# Patient Record
Sex: Male | Born: 1950 | Race: White | Hispanic: No | Marital: Married | Smoking: Former smoker
Health system: Southern US, Community
[De-identification: ages and names within clinical notes are randomized; demographics above are authoritative.]

## PROBLEM LIST (undated history)

## (undated) ENCOUNTER — Emergency Department (HOSPITAL_COMMUNITY): Discharge status: Against Medical Advice | Payer: BC Managed Care – PPO

## (undated) DIAGNOSIS — C801 Malignant (primary) neoplasm, unspecified: Secondary | ICD-10-CM

## (undated) DIAGNOSIS — E785 Hyperlipidemia, unspecified: Secondary | ICD-10-CM

## (undated) DIAGNOSIS — I10 Essential (primary) hypertension: Secondary | ICD-10-CM

## (undated) DIAGNOSIS — Z923 Personal history of irradiation: Secondary | ICD-10-CM

## (undated) DIAGNOSIS — E119 Type 2 diabetes mellitus without complications: Secondary | ICD-10-CM

## (undated) HISTORY — PX: ORTHOPEDIC SURGERY: SHX850

## (undated) HISTORY — PX: CARDIAC CATHETERIZATION: SHX172

## (undated) HISTORY — PX: OTHER SURGICAL HISTORY: SHX169

## (undated) HISTORY — DX: Personal history of irradiation: Z92.3

## (undated) HISTORY — DX: Hyperlipidemia, unspecified: E78.5

## (undated) HISTORY — DX: Type 2 diabetes mellitus without complications: E11.9

## (undated) HISTORY — PX: CORONARY ANGIOPLASTY: SHX604

## (undated) HISTORY — DX: Essential (primary) hypertension: I10

## (undated) HISTORY — DX: Malignant (primary) neoplasm, unspecified: C80.1

---

## 2002-01-04 ENCOUNTER — Ambulatory Visit (HOSPITAL_COMMUNITY): Admission: RE | Admit: 2002-01-04 | Discharge: 2002-01-04 | Payer: Self-pay | Admitting: *Deleted

## 2002-01-04 ENCOUNTER — Encounter (INDEPENDENT_AMBULATORY_CARE_PROVIDER_SITE_OTHER): Payer: Self-pay

## 2010-04-20 ENCOUNTER — Ambulatory Visit
Admission: RE | Admit: 2010-04-20 | Discharge: 2010-05-15 | Payer: Self-pay | Source: Home / Self Care | Attending: Radiation Oncology | Admitting: Radiation Oncology

## 2010-05-01 ENCOUNTER — Encounter
Admission: RE | Admit: 2010-05-01 | Discharge: 2010-05-01 | Payer: Self-pay | Source: Home / Self Care | Attending: Urology | Admitting: Urology

## 2010-05-16 ENCOUNTER — Ambulatory Visit: Payer: BC Managed Care – PPO | Admitting: Radiation Oncology

## 2010-06-13 LAB — COMPREHENSIVE METABOLIC PANEL
ALT: 28 U/L (ref 0–53)
AST: 35 U/L (ref 0–37)
Alkaline Phosphatase: 62 U/L (ref 39–117)
CO2: 31 mEq/L (ref 19–32)
Calcium: 9.9 mg/dL (ref 8.4–10.5)
GFR calc Af Amer: >60 mL/min (ref >60)
Potassium: 4.6 mEq/L (ref 3.5–5.1)
Sodium: 139 mEq/L (ref 135–145)
Total Protein: 6.7 g/dL (ref 6.0–8.3)

## 2010-06-13 LAB — CBC
Hemoglobin: 14.8 g/dL (ref 13.0–17.0)
MCHC: 34.6 g/dL (ref 30.0–36.0)
WBC: 6.4 10*3/uL (ref 4.0–10.5)

## 2010-06-20 ENCOUNTER — Ambulatory Visit (HOSPITAL_COMMUNITY): Payer: BC Managed Care – PPO | Attending: Urology

## 2010-06-20 ENCOUNTER — Ambulatory Visit (HOSPITAL_BASED_OUTPATIENT_CLINIC_OR_DEPARTMENT_OTHER)
Admission: RE | Admit: 2010-06-20 | Discharge: 2010-06-20 | Disposition: A | Discharge status: Home / Self Care | Payer: BC Managed Care – PPO | Attending: Urology | Admitting: Urology

## 2010-06-20 DIAGNOSIS — Z79899 Other long term (current) drug therapy: Secondary | ICD-10-CM | POA: Insufficient documentation

## 2010-06-20 DIAGNOSIS — C61 Malignant neoplasm of prostate: Secondary | ICD-10-CM | POA: Insufficient documentation

## 2010-06-20 DIAGNOSIS — Z9861 Coronary angioplasty status: Secondary | ICD-10-CM | POA: Insufficient documentation

## 2010-06-20 DIAGNOSIS — I2510 Atherosclerotic heart disease of native coronary artery without angina pectoris: Secondary | ICD-10-CM | POA: Insufficient documentation

## 2010-06-20 DIAGNOSIS — I10 Essential (primary) hypertension: Secondary | ICD-10-CM | POA: Insufficient documentation

## 2010-06-20 DIAGNOSIS — Z7982 Long term (current) use of aspirin: Secondary | ICD-10-CM | POA: Insufficient documentation

## 2010-06-21 LAB — GLUCOSE, CAPILLARY: Glucose-Capillary: 171 mg/dL — ABNORMAL HIGH (ref 70–99)

## 2010-07-10 ENCOUNTER — Ambulatory Visit: Payer: BC Managed Care – PPO | Attending: Radiation Oncology | Admitting: Radiation Oncology

## 2010-07-10 DIAGNOSIS — C61 Malignant neoplasm of prostate: Secondary | ICD-10-CM | POA: Insufficient documentation

## 2010-07-10 DIAGNOSIS — E119 Type 2 diabetes mellitus without complications: Secondary | ICD-10-CM | POA: Insufficient documentation

## 2010-07-10 DIAGNOSIS — R350 Frequency of micturition: Secondary | ICD-10-CM | POA: Insufficient documentation

## 2010-07-10 DIAGNOSIS — R300 Dysuria: Secondary | ICD-10-CM | POA: Insufficient documentation

## 2010-08-31 NOTE — Op Note (Signed)
   NAMEDANEN, LAPAGLIA NO.:  [id number]   MEDICAL RECORD NO.:  [id number]                   PATIENT TYPE:  AMB   LOCATION:  ENDO                                 FACILITY:  [facility]   PHYSICIAN:  Georgiana Spinner, M.D.                 DATE OF BIRTH:  [birth year]   DATE OF PROCEDURE:  DATE OF DISCHARGE:                                 OPERATIVE REPORT   PROCEDURE:  Upper endoscopy with biopsy.   INDICATIONS FOR PROCEDURE:  Gastroesophageal reflux disease.   ANESTHESIA:  Demerol 40, Versed 5 mg.   DESCRIPTION OF PROCEDURE:  With the patient mildly sedated in the left  lateral decubitus position, the Olympus videoscopic endoscope was inserted  in the mouth and passed under direct vision through the esophagus which  appeared normal until we reached the distal esophagus and there were changes  of Barrett's esophagus and esophagitis seen and photographed. Once biopsied,  we entered into the stomach through a hiatal hernia. The fundus and body  appeared normal. The antrum showed changes of gastritis, photographed and  biopsied. We entered into the duodenal bulb which showed duodenitis, second  portion of the duodenum appeared normal. From this point, the endoscope was  slowly withdrawn taking circumferential views of the entire duodenal mucosa  until the endoscope was then pulled back in the stomach, placed in  retroflexion to view the stomach from below and a hiatal hernia was seen.  The endoscope was straightened and withdrawn taking circumferential views of  the remaining gastric and esophageal mucosa. The patient's vital signs and  pulse oximeter remained stable. The patient tolerated the procedure well  without apparent complications.   FINDINGS:  Changes of duodenitis, gastritis, esophagitis with Barrett's  esophagus.   PLAN:  Await biopsy report. The patient will call me for results and  followup with me as an outpatient. Proceed to colonoscopy as  planned.                                                Georgiana Spinner, M.D.    GMO/MEDQ  D:  01/04/2002  T:  01/04/2002  Job:  [id number]

## 2010-08-31 NOTE — Op Note (Signed)
   NAMEARHAN, MCMANAMON NO.:  [id number]   MEDICAL RECORD NO.:  [id number]                   PATIENT TYPE:  AMB   LOCATION:  ENDO                                 FACILITY:  [facility]   PHYSICIAN:  Georgiana Spinner, M.D.                 DATE OF BIRTH:  Nov 26, 1950   DATE OF PROCEDURE:  DATE OF DISCHARGE:                                 OPERATIVE REPORT   PROCEDURE:  Colonoscopy.   INDICATIONS FOR PROCEDURE:  Hemoccult positivity.   ANESTHESIA:  Versed 1 mg.   DESCRIPTION OF PROCEDURE:  With the patient mildly sedated in the left  lateral decubitus position, a rectal exam was performed which was  unremarkable. Subsequently, the Olympus videoscopic colonoscope was inserted  into the rectum and passed under direct vision to the cecum identified by  the ileocecal valve and appendiceal orifice both of which were photographed.  From this point, the colonoscope was slowly withdrawn taking circumferential  views of the entire colonic mucosa stopping then only in the rectum which  appeared normal on direct and showed hemorrhoids on retroflexed view. The  endoscope was straightened and withdrawn. The patient's vital signs and  pulse oximeter remained stable. The patient tolerated the procedure well  without apparent complications.   FINDINGS:  Interna hemorrhoids, otherwise, unremarkable examination.   PLAN:  Repeat exam in five years due to family history. See endoscopy note  for further details.                                                Georgiana Spinner, M.D.    GMO/MEDQ  D:  01/04/2002  T:  01/04/2002  Job:  [id number]

## 2015-01-04 DIAGNOSIS — I10 Essential (primary) hypertension: Secondary | ICD-10-CM | POA: Insufficient documentation

## 2015-01-04 DIAGNOSIS — I2510 Atherosclerotic heart disease of native coronary artery without angina pectoris: Secondary | ICD-10-CM | POA: Insufficient documentation

## 2015-01-04 DIAGNOSIS — E782 Mixed hyperlipidemia: Secondary | ICD-10-CM | POA: Insufficient documentation

## 2015-01-04 DIAGNOSIS — E119 Type 2 diabetes mellitus without complications: Secondary | ICD-10-CM | POA: Insufficient documentation

## 2015-06-06 DIAGNOSIS — K219 Gastro-esophageal reflux disease without esophagitis: Secondary | ICD-10-CM | POA: Diagnosis not present

## 2015-06-19 DIAGNOSIS — N3289 Other specified disorders of bladder: Secondary | ICD-10-CM | POA: Diagnosis not present

## 2015-06-19 DIAGNOSIS — C61 Malignant neoplasm of prostate: Secondary | ICD-10-CM | POA: Diagnosis not present

## 2015-07-25 DIAGNOSIS — I2510 Atherosclerotic heart disease of native coronary artery without angina pectoris: Secondary | ICD-10-CM | POA: Diagnosis not present

## 2015-08-02 DIAGNOSIS — I2510 Atherosclerotic heart disease of native coronary artery without angina pectoris: Secondary | ICD-10-CM | POA: Diagnosis not present

## 2015-08-02 DIAGNOSIS — I10 Essential (primary) hypertension: Secondary | ICD-10-CM | POA: Diagnosis not present

## 2015-08-02 DIAGNOSIS — E782 Mixed hyperlipidemia: Secondary | ICD-10-CM | POA: Diagnosis not present

## 2015-08-02 DIAGNOSIS — E119 Type 2 diabetes mellitus without complications: Secondary | ICD-10-CM | POA: Diagnosis not present

## 2015-08-25 DIAGNOSIS — E119 Type 2 diabetes mellitus without complications: Secondary | ICD-10-CM | POA: Diagnosis not present

## 2015-08-25 DIAGNOSIS — Z1389 Encounter for screening for other disorder: Secondary | ICD-10-CM | POA: Diagnosis not present

## 2015-08-25 DIAGNOSIS — E785 Hyperlipidemia, unspecified: Secondary | ICD-10-CM | POA: Diagnosis not present

## 2015-11-30 DIAGNOSIS — Z9181 History of falling: Secondary | ICD-10-CM | POA: Diagnosis not present

## 2015-11-30 DIAGNOSIS — Z0000 Encounter for general adult medical examination without abnormal findings: Secondary | ICD-10-CM | POA: Diagnosis not present

## 2015-12-20 DIAGNOSIS — N3289 Other specified disorders of bladder: Secondary | ICD-10-CM | POA: Diagnosis not present

## 2015-12-20 DIAGNOSIS — C61 Malignant neoplasm of prostate: Secondary | ICD-10-CM | POA: Diagnosis not present

## 2016-01-29 DIAGNOSIS — I2510 Atherosclerotic heart disease of native coronary artery without angina pectoris: Secondary | ICD-10-CM | POA: Diagnosis not present

## 2016-01-31 DIAGNOSIS — E782 Mixed hyperlipidemia: Secondary | ICD-10-CM | POA: Diagnosis not present

## 2016-01-31 DIAGNOSIS — I10 Essential (primary) hypertension: Secondary | ICD-10-CM | POA: Diagnosis not present

## 2016-01-31 DIAGNOSIS — E119 Type 2 diabetes mellitus without complications: Secondary | ICD-10-CM | POA: Diagnosis not present

## 2016-01-31 DIAGNOSIS — I2510 Atherosclerotic heart disease of native coronary artery without angina pectoris: Secondary | ICD-10-CM | POA: Diagnosis not present

## 2016-04-01 DIAGNOSIS — E1165 Type 2 diabetes mellitus with hyperglycemia: Secondary | ICD-10-CM | POA: Diagnosis not present

## 2016-04-01 DIAGNOSIS — Z23 Encounter for immunization: Secondary | ICD-10-CM | POA: Diagnosis not present

## 2016-06-18 DIAGNOSIS — N529 Male erectile dysfunction, unspecified: Secondary | ICD-10-CM | POA: Diagnosis not present

## 2016-06-18 DIAGNOSIS — C61 Malignant neoplasm of prostate: Secondary | ICD-10-CM | POA: Diagnosis not present

## 2016-07-01 DIAGNOSIS — E1165 Type 2 diabetes mellitus with hyperglycemia: Secondary | ICD-10-CM | POA: Diagnosis not present

## 2016-07-02 DIAGNOSIS — R972 Elevated prostate specific antigen [PSA]: Secondary | ICD-10-CM | POA: Diagnosis not present

## 2016-07-02 DIAGNOSIS — C61 Malignant neoplasm of prostate: Secondary | ICD-10-CM | POA: Diagnosis not present

## 2016-07-08 DIAGNOSIS — E1129 Type 2 diabetes mellitus with other diabetic kidney complication: Secondary | ICD-10-CM | POA: Diagnosis not present

## 2016-07-08 DIAGNOSIS — Z6831 Body mass index (BMI) 31.0-31.9, adult: Secondary | ICD-10-CM | POA: Diagnosis not present

## 2016-07-11 DIAGNOSIS — R300 Dysuria: Secondary | ICD-10-CM | POA: Diagnosis not present

## 2016-07-11 DIAGNOSIS — C61 Malignant neoplasm of prostate: Secondary | ICD-10-CM | POA: Diagnosis not present

## 2016-07-11 DIAGNOSIS — N390 Urinary tract infection, site not specified: Secondary | ICD-10-CM | POA: Diagnosis not present

## 2016-07-11 DIAGNOSIS — R972 Elevated prostate specific antigen [PSA]: Secondary | ICD-10-CM | POA: Diagnosis not present

## 2016-07-25 DIAGNOSIS — C61 Malignant neoplasm of prostate: Secondary | ICD-10-CM | POA: Diagnosis not present

## 2016-08-13 DIAGNOSIS — K5900 Constipation, unspecified: Secondary | ICD-10-CM | POA: Diagnosis not present

## 2016-08-13 DIAGNOSIS — K6289 Other specified diseases of anus and rectum: Secondary | ICD-10-CM | POA: Diagnosis not present

## 2016-08-22 DIAGNOSIS — C61 Malignant neoplasm of prostate: Secondary | ICD-10-CM | POA: Diagnosis not present

## 2016-08-22 DIAGNOSIS — N4289 Other specified disorders of prostate: Secondary | ICD-10-CM | POA: Diagnosis not present

## 2016-08-22 DIAGNOSIS — R972 Elevated prostate specific antigen [PSA]: Secondary | ICD-10-CM | POA: Diagnosis not present

## 2016-08-26 DIAGNOSIS — I2510 Atherosclerotic heart disease of native coronary artery without angina pectoris: Secondary | ICD-10-CM | POA: Diagnosis not present

## 2016-08-28 DIAGNOSIS — R972 Elevated prostate specific antigen [PSA]: Secondary | ICD-10-CM | POA: Diagnosis not present

## 2016-08-28 DIAGNOSIS — C61 Malignant neoplasm of prostate: Secondary | ICD-10-CM | POA: Diagnosis not present

## 2016-08-30 DIAGNOSIS — I10 Essential (primary) hypertension: Secondary | ICD-10-CM | POA: Diagnosis not present

## 2016-08-30 DIAGNOSIS — I2510 Atherosclerotic heart disease of native coronary artery without angina pectoris: Secondary | ICD-10-CM | POA: Diagnosis not present

## 2016-08-30 DIAGNOSIS — E782 Mixed hyperlipidemia: Secondary | ICD-10-CM | POA: Diagnosis not present

## 2016-08-30 DIAGNOSIS — E119 Type 2 diabetes mellitus without complications: Secondary | ICD-10-CM | POA: Diagnosis not present

## 2016-09-02 ENCOUNTER — Other Ambulatory Visit (HOSPITAL_COMMUNITY): Payer: Self-pay | Admitting: Urology

## 2016-09-02 DIAGNOSIS — C61 Malignant neoplasm of prostate: Secondary | ICD-10-CM

## 2016-09-04 DIAGNOSIS — Z6831 Body mass index (BMI) 31.0-31.9, adult: Secondary | ICD-10-CM | POA: Diagnosis not present

## 2016-09-04 DIAGNOSIS — M546 Pain in thoracic spine: Secondary | ICD-10-CM | POA: Diagnosis not present

## 2016-09-06 DIAGNOSIS — I2510 Atherosclerotic heart disease of native coronary artery without angina pectoris: Secondary | ICD-10-CM | POA: Diagnosis not present

## 2016-09-06 DIAGNOSIS — E782 Mixed hyperlipidemia: Secondary | ICD-10-CM | POA: Diagnosis not present

## 2016-09-06 DIAGNOSIS — E119 Type 2 diabetes mellitus without complications: Secondary | ICD-10-CM | POA: Diagnosis not present

## 2016-09-06 DIAGNOSIS — I10 Essential (primary) hypertension: Secondary | ICD-10-CM | POA: Diagnosis not present

## 2016-09-17 ENCOUNTER — Encounter (HOSPITAL_COMMUNITY)
Admission: RE | Admit: 2016-09-17 | Discharge: 2016-09-17 | Disposition: A | Discharge status: Home / Self Care | Payer: PPO | Source: Ambulatory Visit | Attending: Urology | Admitting: Urology

## 2016-09-17 DIAGNOSIS — C61 Malignant neoplasm of prostate: Secondary | ICD-10-CM

## 2016-09-17 MED ORDER — FLUDEOXYGLUCOSE F - 18 (FDG) INJECTION
9.3000 | Freq: Once | INTRAVENOUS | Status: AC | PRN
  Administered 2016-09-17: 9.3 via INTRAVENOUS

## 2016-09-18 DIAGNOSIS — N1330 Unspecified hydronephrosis: Secondary | ICD-10-CM | POA: Diagnosis not present

## 2016-09-18 DIAGNOSIS — C61 Malignant neoplasm of prostate: Secondary | ICD-10-CM | POA: Diagnosis not present

## 2016-09-26 DIAGNOSIS — C7951 Secondary malignant neoplasm of bone: Secondary | ICD-10-CM | POA: Diagnosis not present

## 2016-09-26 DIAGNOSIS — C61 Malignant neoplasm of prostate: Secondary | ICD-10-CM | POA: Diagnosis not present

## 2016-09-26 DIAGNOSIS — R937 Abnormal findings on diagnostic imaging of other parts of musculoskeletal system: Secondary | ICD-10-CM | POA: Diagnosis not present

## 2016-09-26 DIAGNOSIS — N1330 Unspecified hydronephrosis: Secondary | ICD-10-CM | POA: Diagnosis not present

## 2016-09-26 DIAGNOSIS — C779 Secondary and unspecified malignant neoplasm of lymph node, unspecified: Secondary | ICD-10-CM | POA: Diagnosis not present

## 2016-10-02 DIAGNOSIS — C61 Malignant neoplasm of prostate: Secondary | ICD-10-CM | POA: Diagnosis not present

## 2016-10-02 DIAGNOSIS — R972 Elevated prostate specific antigen [PSA]: Secondary | ICD-10-CM | POA: Diagnosis not present

## 2016-10-15 DIAGNOSIS — Z6829 Body mass index (BMI) 29.0-29.9, adult: Secondary | ICD-10-CM | POA: Diagnosis not present

## 2016-10-15 DIAGNOSIS — C61 Malignant neoplasm of prostate: Secondary | ICD-10-CM | POA: Diagnosis not present

## 2016-10-15 DIAGNOSIS — Z1389 Encounter for screening for other disorder: Secondary | ICD-10-CM | POA: Diagnosis not present

## 2016-10-15 DIAGNOSIS — C7951 Secondary malignant neoplasm of bone: Secondary | ICD-10-CM | POA: Diagnosis not present

## 2016-10-21 DIAGNOSIS — Z8042 Family history of malignant neoplasm of prostate: Secondary | ICD-10-CM | POA: Diagnosis not present

## 2016-10-21 DIAGNOSIS — C778 Secondary and unspecified malignant neoplasm of lymph nodes of multiple regions: Secondary | ICD-10-CM | POA: Diagnosis not present

## 2016-10-21 DIAGNOSIS — C7982 Secondary malignant neoplasm of genital organs: Secondary | ICD-10-CM | POA: Diagnosis not present

## 2016-10-21 DIAGNOSIS — C61 Malignant neoplasm of prostate: Secondary | ICD-10-CM | POA: Diagnosis not present

## 2016-10-21 DIAGNOSIS — Z801 Family history of malignant neoplasm of trachea, bronchus and lung: Secondary | ICD-10-CM | POA: Diagnosis not present

## 2016-10-21 DIAGNOSIS — C7951 Secondary malignant neoplasm of bone: Secondary | ICD-10-CM | POA: Diagnosis not present

## 2016-10-21 DIAGNOSIS — Z803 Family history of malignant neoplasm of breast: Secondary | ICD-10-CM | POA: Diagnosis not present

## 2016-10-31 DIAGNOSIS — E1165 Type 2 diabetes mellitus with hyperglycemia: Secondary | ICD-10-CM | POA: Diagnosis not present

## 2016-11-01 DIAGNOSIS — C61 Malignant neoplasm of prostate: Secondary | ICD-10-CM | POA: Diagnosis not present

## 2016-11-01 DIAGNOSIS — C7951 Secondary malignant neoplasm of bone: Secondary | ICD-10-CM | POA: Diagnosis not present

## 2016-11-05 DIAGNOSIS — C7951 Secondary malignant neoplasm of bone: Secondary | ICD-10-CM | POA: Diagnosis not present

## 2016-11-05 DIAGNOSIS — C61 Malignant neoplasm of prostate: Secondary | ICD-10-CM | POA: Diagnosis not present

## 2016-11-06 DIAGNOSIS — R339 Retention of urine, unspecified: Secondary | ICD-10-CM | POA: Diagnosis not present

## 2016-11-06 DIAGNOSIS — C61 Malignant neoplasm of prostate: Secondary | ICD-10-CM | POA: Diagnosis not present

## 2016-12-02 DIAGNOSIS — Z0000 Encounter for general adult medical examination without abnormal findings: Secondary | ICD-10-CM | POA: Diagnosis not present

## 2016-12-02 DIAGNOSIS — Z6831 Body mass index (BMI) 31.0-31.9, adult: Secondary | ICD-10-CM | POA: Diagnosis not present

## 2016-12-02 DIAGNOSIS — Z9181 History of falling: Secondary | ICD-10-CM | POA: Diagnosis not present

## 2016-12-02 DIAGNOSIS — E1165 Type 2 diabetes mellitus with hyperglycemia: Secondary | ICD-10-CM | POA: Diagnosis not present

## 2016-12-03 DIAGNOSIS — N1330 Unspecified hydronephrosis: Secondary | ICD-10-CM | POA: Diagnosis not present

## 2016-12-03 DIAGNOSIS — C61 Malignant neoplasm of prostate: Secondary | ICD-10-CM | POA: Diagnosis not present

## 2016-12-03 DIAGNOSIS — N134 Hydroureter: Secondary | ICD-10-CM | POA: Diagnosis not present

## 2016-12-03 DIAGNOSIS — C7951 Secondary malignant neoplasm of bone: Secondary | ICD-10-CM | POA: Diagnosis not present

## 2016-12-03 DIAGNOSIS — C7989 Secondary malignant neoplasm of other specified sites: Secondary | ICD-10-CM | POA: Diagnosis not present

## 2016-12-18 DIAGNOSIS — C61 Malignant neoplasm of prostate: Secondary | ICD-10-CM | POA: Diagnosis not present

## 2016-12-18 DIAGNOSIS — R351 Nocturia: Secondary | ICD-10-CM | POA: Diagnosis not present

## 2016-12-18 DIAGNOSIS — N1330 Unspecified hydronephrosis: Secondary | ICD-10-CM | POA: Diagnosis not present

## 2016-12-31 DIAGNOSIS — C7951 Secondary malignant neoplasm of bone: Secondary | ICD-10-CM | POA: Diagnosis not present

## 2016-12-31 DIAGNOSIS — C61 Malignant neoplasm of prostate: Secondary | ICD-10-CM | POA: Diagnosis not present

## 2016-12-31 DIAGNOSIS — Z515 Encounter for palliative care: Secondary | ICD-10-CM | POA: Diagnosis not present

## 2017-01-17 DIAGNOSIS — R339 Retention of urine, unspecified: Secondary | ICD-10-CM | POA: Diagnosis not present

## 2017-01-17 DIAGNOSIS — C61 Malignant neoplasm of prostate: Secondary | ICD-10-CM | POA: Diagnosis not present

## 2017-01-23 ENCOUNTER — Encounter: Payer: Self-pay | Admitting: Cardiology

## 2017-01-23 ENCOUNTER — Ambulatory Visit (INDEPENDENT_AMBULATORY_CARE_PROVIDER_SITE_OTHER): Payer: PPO | Admitting: Cardiology

## 2017-01-23 VITALS — BP 140/82 | HR 62 | Resp 17 | Ht 68.0 in | Wt 208.1 lb

## 2017-01-23 DIAGNOSIS — I2510 Atherosclerotic heart disease of native coronary artery without angina pectoris: Secondary | ICD-10-CM

## 2017-01-23 DIAGNOSIS — I10 Essential (primary) hypertension: Secondary | ICD-10-CM

## 2017-01-23 DIAGNOSIS — E119 Type 2 diabetes mellitus without complications: Secondary | ICD-10-CM | POA: Diagnosis not present

## 2017-01-23 DIAGNOSIS — E782 Mixed hyperlipidemia: Secondary | ICD-10-CM | POA: Diagnosis not present

## 2017-01-23 NOTE — Patient Instructions (Signed)
Medication Instructions:  Your physician recommends that you continue on your current medications as directed. Please refer to the Current Medication list given to you today.  Labwork: None    Testing/Procedures: None   Follow-Up: Your physician wants you to follow-up in: 6 months. You will receive a reminder letter in the mail two months in advance. If you don't receive a letter, please call our office to schedule the follow-up appointment.  Any Other Special Instructions Will Be Listed Below (If Applicable).  Please note that any paperwork needing to be filled out by the provider will need to be addressed at the front desk prior to seeing the provider. Please note that any paperwork FMLA, Disability or other documents regarding health condition is subject to a $25.00 charge that must be received prior to completion of paperwork in the form of a money order or check.    If you need a refill on your cardiac medications before your next appointment, please call your pharmacy.  

## 2017-01-23 NOTE — Progress Notes (Signed)
Cardiology Office Note:    Date:  01/23/2017   ID:  Perry Wolfe, DOB 06/15/1950, MRN [id number]  PCP:  Angelina Sheriff, MD  Cardiologist:  Jenne Campus, MD    Referring MD: Angelina Sheriff, MD   Chief Complaint  Patient presents with  . Follow-up    no concerns; states last March his PSA was higher than normal and biopsy came back normal; PET scan showed concern in pelvic area   I am doing well cardiac-wise  History of Present Illness:    Perry Wolfe is a 66 y.o. male  with coronary artery disease diabetes and essential hypertension. Overall, because he is doing well. Recently he was fine to have reactivation of his prostate cancer. Hormonal therapy has been initiated. His PSA went down to almost nothing. He is doing well, still active, walks a lot of no difficulty doing this.  Past Medical History:  Diagnosis Date  . Cancer ([place])   . Diabetes mellitus without complication ([place])   . Hx of radiation therapy   . Hyperlipidemia   . Hypertension     Past Surgical History:  Procedure Laterality Date  . CARDIAC CATHETERIZATION    . CORONARY ANGIOPLASTY    . ORTHOPEDIC SURGERY    . Prostate Injection      Current Medications: Current Meds  Medication Sig  . Ascorbic Acid (VITAMIN C) 1000 MG tablet Take 100,000 mg by mouth daily.  Marland Kitchen aspirin EC 81 MG tablet Take 81 mg by mouth daily.  . Calcium Citrate-Vitamin D (CALCIUM CITRATE CHEWY BITE PO) Take 650 mg by mouth 2 (two) times daily.  Marland Kitchen lisinopril (PRINIVIL,ZESTRIL) 5 MG tablet Take 5 mg by mouth daily.   . metformin (FORTAMET) 500 MG (OSM) 24 hr tablet Take 500 mg by mouth daily with breakfast.   . metoprolol succinate (TOPROL-XL) 25 MG 24 hr tablet Take 25 mg by mouth daily.  . nitroGLYCERIN (NITROSTAT) 0.4 MG SL tablet Place 0.4 mg under the tongue as needed for chest pain.  . Omega-3 Fatty Acids (FISH OIL) 1000 MG CAPS Take 1 capsule by mouth daily.  Marland Kitchen omeprazole (PRILOSEC) 20 MG capsule Take 20 mg by mouth  daily.  . rosuvastatin (CRESTOR) 10 MG tablet Take 10 mg by mouth daily.  . tamsulosin (FLOMAX) 0.4 MG CAPS capsule Take 0.8 mg by mouth daily.      Allergies:   Patient has no allergy information on record.   Social History   Social History  . Marital status: Married    Spouse name: N/A  . Number of children: N/A  . Years of education: N/A   Social History Main Topics  . Smoking status: Former Research scientist (life sciences)  . Smokeless tobacco: Never Used  . Alcohol use Yes  . Drug use: No  . Sexual activity: Not Asked   Other Topics Concern  . None   Social History Narrative  . None     Family History: The patient's family history includes Lung cancer in his father; Stroke in his father. ROS:   Please see the history of present illness.    All 14 point review of systems negative except as described per history of present illness  EKGs/Labs/Other Studies Reviewed:      Recent Labs: No results found for requested labs within last 8760 hours.  Recent Lipid Panel No results found for: CHOL, TRIG, HDL, CHOLHDL, VLDL, LDLCALC, LDLDIRECT  Physical Exam:    VS:  BP 140/82 (BP Location: Left Arm, Patient  Position: Sitting, Cuff Size: Large)   Pulse 62   Resp 17   Ht 5\' 8"  (1.727 m)   Wt 208 lb 1.9 oz (94.4 kg)   BMI 31.64 kg/m     Wt Readings from Last 3 Encounters:  01/23/17 208 lb 1.9 oz (94.4 kg)     GEN:  Well nourished, well developed in no acute distress HEENT: Normal NECK: No JVD; No carotid bruits LYMPHATICS: No lymphadenopathy CARDIAC: RRR, no murmurs, no rubs, no gallops RESPIRATORY:  Clear to auscultation without rales, wheezing or rhonchi  ABDOMEN: Soft, non-tender, non-distended MUSCULOSKELETAL:  No edema; No deformity  SKIN: Warm and dry LOWER EXTREMITIES: no swelling NEUROLOGIC:  Alert and oriented x 3 PSYCHIATRIC:  Normal affect   ASSESSMENT:    1. Coronary artery disease involving native coronary artery of native heart without angina pectoris   2.  Essential hypertension   3. Type 2 diabetes mellitus without complication, without long-term current use of insulin ([place])   4. Mixed hyperlipidemia    PLAN:    In order of problems listed above:  1. Coronary artery disease: Doing well from that point of asymptomatic, will continue present management 2. Essential hypertension slight elevated today but overall doing well and he tells me that he check his doctor at home and school. They'll continue present management. 3. Type 2 diabetes, (stable. 4. Dyslipidemia: His last cholesterol which was checked in may was perfect. 5. He did have echocardiogram done in our old office will retrieve results of it since he was not informed about   Medication Adjustments/Labs and Tests Ordered: Current medicines are reviewed at length with the patient today.  Concerns regarding medicines are outlined above.  No orders of the defined types were placed in this encounter.  Medication changes: No orders of the defined types were placed in this encounter.   Signed, Park Liter, MD, [facility] 01/23/2017 10:15 AM    [place]

## 2017-01-28 DIAGNOSIS — C7951 Secondary malignant neoplasm of bone: Secondary | ICD-10-CM | POA: Diagnosis not present

## 2017-01-28 DIAGNOSIS — C61 Malignant neoplasm of prostate: Secondary | ICD-10-CM | POA: Diagnosis not present

## 2017-02-05 DIAGNOSIS — Z1339 Encounter for screening examination for other mental health and behavioral disorders: Secondary | ICD-10-CM | POA: Diagnosis not present

## 2017-02-05 DIAGNOSIS — Z0000 Encounter for general adult medical examination without abnormal findings: Secondary | ICD-10-CM | POA: Diagnosis not present

## 2017-02-05 DIAGNOSIS — Z23 Encounter for immunization: Secondary | ICD-10-CM | POA: Diagnosis not present

## 2017-02-12 DIAGNOSIS — I700 Atherosclerosis of aorta: Secondary | ICD-10-CM | POA: Diagnosis not present

## 2017-02-12 DIAGNOSIS — F17210 Nicotine dependence, cigarettes, uncomplicated: Secondary | ICD-10-CM | POA: Diagnosis not present

## 2017-02-12 DIAGNOSIS — Z136 Encounter for screening for cardiovascular disorders: Secondary | ICD-10-CM | POA: Diagnosis not present

## 2017-02-25 DIAGNOSIS — C7951 Secondary malignant neoplasm of bone: Secondary | ICD-10-CM | POA: Diagnosis not present

## 2017-02-25 DIAGNOSIS — C61 Malignant neoplasm of prostate: Secondary | ICD-10-CM | POA: Diagnosis not present

## 2017-03-25 DIAGNOSIS — Z515 Encounter for palliative care: Secondary | ICD-10-CM | POA: Diagnosis not present

## 2017-03-25 DIAGNOSIS — C61 Malignant neoplasm of prostate: Secondary | ICD-10-CM | POA: Diagnosis not present

## 2017-03-25 DIAGNOSIS — C7951 Secondary malignant neoplasm of bone: Secondary | ICD-10-CM | POA: Diagnosis not present

## 2017-04-21 DIAGNOSIS — R339 Retention of urine, unspecified: Secondary | ICD-10-CM | POA: Diagnosis not present

## 2017-04-21 DIAGNOSIS — C61 Malignant neoplasm of prostate: Secondary | ICD-10-CM | POA: Diagnosis not present

## 2017-04-22 DIAGNOSIS — Z5111 Encounter for antineoplastic chemotherapy: Secondary | ICD-10-CM | POA: Diagnosis not present

## 2017-04-22 DIAGNOSIS — C61 Malignant neoplasm of prostate: Secondary | ICD-10-CM | POA: Diagnosis not present

## 2017-04-22 DIAGNOSIS — C7951 Secondary malignant neoplasm of bone: Secondary | ICD-10-CM | POA: Diagnosis not present

## 2017-05-20 DIAGNOSIS — C61 Malignant neoplasm of prostate: Secondary | ICD-10-CM | POA: Diagnosis not present

## 2017-05-20 DIAGNOSIS — C7951 Secondary malignant neoplasm of bone: Secondary | ICD-10-CM | POA: Diagnosis not present

## 2017-06-17 DIAGNOSIS — C61 Malignant neoplasm of prostate: Secondary | ICD-10-CM | POA: Diagnosis not present

## 2017-06-17 DIAGNOSIS — C7951 Secondary malignant neoplasm of bone: Secondary | ICD-10-CM | POA: Diagnosis not present

## 2017-07-15 DIAGNOSIS — C61 Malignant neoplasm of prostate: Secondary | ICD-10-CM | POA: Diagnosis not present

## 2017-07-15 DIAGNOSIS — C7951 Secondary malignant neoplasm of bone: Secondary | ICD-10-CM | POA: Diagnosis not present

## 2017-07-23 ENCOUNTER — Ambulatory Visit: Payer: PPO | Admitting: Cardiology

## 2017-07-23 ENCOUNTER — Encounter: Payer: Self-pay | Admitting: Cardiology

## 2017-07-23 VITALS — BP 110/64 | HR 59 | Ht 68.0 in | Wt 208.4 lb

## 2017-07-23 DIAGNOSIS — E782 Mixed hyperlipidemia: Secondary | ICD-10-CM | POA: Diagnosis not present

## 2017-07-23 DIAGNOSIS — E119 Type 2 diabetes mellitus without complications: Secondary | ICD-10-CM | POA: Diagnosis not present

## 2017-07-23 DIAGNOSIS — I10 Essential (primary) hypertension: Secondary | ICD-10-CM | POA: Diagnosis not present

## 2017-07-23 DIAGNOSIS — I2510 Atherosclerotic heart disease of native coronary artery without angina pectoris: Secondary | ICD-10-CM

## 2017-07-23 NOTE — Progress Notes (Signed)
Cardiology Office Note:    Date:  07/23/2017   ID:  Perry Wolfe, DOB November 23, 1950, MRN [id number]  PCP:  Angelina Sheriff, MD  Cardiologist:  Jenne Campus, MD    Referring MD: Angelina Sheriff, MD   Chief Complaint  Patient presents with  . Follow-up  Doing great  History of Present Illness:    Perry Wolfe is a 67 y.o. male with coronary artery disease status post drug-eluting stent to obtuse marginal branch in 2013.  Doing great asymptomatic work hardening to garden.  He can carry very heavy rocks with no major difficulties he does get some shortness of breath but otherwise doing well no chest pain tightness squeezing pressure pain chest.  He does have a problem with his prostate he is in the process of being treated by oncology team.  Apparently tomorrow he does have appointment with them.  Past Medical History:  Diagnosis Date  . Cancer ([place])   . Diabetes mellitus without complication ([place])   . Hx of radiation therapy   . Hyperlipidemia   . Hypertension       Current Medications: Current Meds  Medication Sig  . aspirin EC 81 MG tablet Take 81 mg by mouth every other day.   . Calcium Citrate-Vitamin D (CALCIUM CITRATE CHEWY BITE PO) Take 650 mg by mouth 2 (two) times daily.  Marland Kitchen lisinopril (PRINIVIL,ZESTRIL) 5 MG tablet Take 5 mg by mouth daily.   . metformin (FORTAMET) 500 MG (OSM) 24 hr tablet Take 500 mg by mouth 2 (two) times daily with a meal.   . metoprolol succinate (TOPROL-XL) 25 MG 24 hr tablet Take 25 mg by mouth daily.  . nitroGLYCERIN (NITROSTAT) 0.4 MG SL tablet Place 0.4 mg under the tongue as needed for chest pain.  Marland Kitchen omeprazole (PRILOSEC) 20 MG capsule Take 20 mg by mouth daily.  . rosuvastatin (CRESTOR) 10 MG tablet Take 10 mg by mouth daily.  . tamsulosin (FLOMAX) 0.4 MG CAPS capsule Take 0.8 mg by mouth daily.   Marland Kitchen UNABLE TO FIND Med Name: [id number]     Allergies:   Patient has no known allergies.   Social History   Socioeconomic History  .  Marital status: Married    Spouse name: Not on file  . Number of children: Not on file  . Years of education: Not on file  . Highest education level: Not on file  Occupational History  . Not on file  Social Needs  . Financial resource strain: Not on file  . Food insecurity:    Worry: Not on file    Inability: Not on file  . Transportation needs:    Medical: Not on file    Non-medical: Not on file  Tobacco Use  . Smoking status: Former Research scientist (life sciences)  . Smokeless tobacco: Never Used  Substance and Sexual Activity  . Alcohol use: Yes  . Drug use: No  . Sexual activity: Not on file  Lifestyle  . Physical activity:    Days per week: Not on file    Minutes per session: Not on file  . Stress: Not on file  Relationships  . Social connections:    Talks on phone: Not on file    Gets together: Not on file    Attends religious service: Not on file    Active member of club or organization: Not on file    Attends meetings of clubs or organizations: Not on file    Relationship status: Not  on file  Other Topics Concern  . Not on file  Social History Narrative  . Not on file     Family History: The patient's family history includes Lung cancer in his father; Stroke in his father. ROS:   Please see the history of present illness.    All 14 point review of systems negative except as described per history of present illness  EKGs/Labs/Other Studies Reviewed:      Recent Labs: No results found for requested labs within last 8760 hours.  Recent Lipid Panel No results found for: CHOL, TRIG, HDL, CHOLHDL, VLDL, LDLCALC, LDLDIRECT  Physical Exam:    VS:  BP 110/64   Pulse (!) 59   Ht 5\' 8"  (1.727 m)   Wt 208 lb 6.4 oz (94.5 kg)   SpO2 96%   BMI 31.69 kg/m     Wt Readings from Last 3 Encounters:  07/23/17 208 lb 6.4 oz (94.5 kg)  01/23/17 208 lb 1.9 oz (94.4 kg)     GEN:  Well nourished, well developed in no acute distress HEENT: Normal NECK: No JVD; No carotid  bruits LYMPHATICS: No lymphadenopathy CARDIAC: RRR, no murmurs, no rubs, no gallops RESPIRATORY:  Clear to auscultation without rales, wheezing or rhonchi  ABDOMEN: Soft, non-tender, non-distended MUSCULOSKELETAL:  No edema; No deformity  SKIN: Warm and dry LOWER EXTREMITIES: no swelling NEUROLOGIC:  Alert and oriented x 3 PSYCHIATRIC:  Normal affect   ASSESSMENT:    1. Coronary artery disease involving native coronary artery of native heart without angina pectoris   2. Essential hypertension   3. Type 2 diabetes mellitus without complication, without long-term current use of insulin ([place])   4. Mixed hyperlipidemia    PLAN:    In order of problems listed above:  1. Coronary artery disease: Doing well from that point of view.  We will continue present management. 2. Essential hypertension his blood pressure is perfect will continue present management. 3. Type 2 diabetes: Followed by primary care physician stable 4. Mixed dyslipidemia he is on Crestor which I will continue we will check his fasting lipid level today since he is fasting   Medication Adjustments/Labs and Tests Ordered: Current medicines are reviewed at length with the patient today.  Concerns regarding medicines are outlined above.  No orders of the defined types were placed in this encounter.  Medication changes: No orders of the defined types were placed in this encounter.   Signed, Park Liter, MD, [facility] 07/23/2017 8:16 AM    [place]

## 2017-07-23 NOTE — Addendum Note (Signed)
Addended by: Austin Miles on: 07/23/2017 08:21 AM   Modules accepted: Orders

## 2017-07-23 NOTE — Patient Instructions (Signed)
Medication Instructions:  Your physician recommends that you continue on your current medications as directed. Please refer to the Current Medication list given to you today.   Labwork: Your physician recommends that you return for lab work today: lipid panel.  Testing/Procedures: None  Follow-Up: Your physician wants you to follow-up in: 6 months. You will receive a reminder letter in the mail two months in advance. If you don't receive a letter, please call our office to schedule the follow-up appointment.   Any Other Special Instructions Will Be Listed Below (If Applicable).     If you need a refill on your cardiac medications before your next appointment, please call your pharmacy.

## 2017-07-24 ENCOUNTER — Telehealth: Payer: Self-pay | Admitting: *Deleted

## 2017-07-24 DIAGNOSIS — I10 Essential (primary) hypertension: Secondary | ICD-10-CM

## 2017-07-24 DIAGNOSIS — C61 Malignant neoplasm of prostate: Secondary | ICD-10-CM | POA: Diagnosis not present

## 2017-07-24 DIAGNOSIS — N3289 Other specified disorders of bladder: Secondary | ICD-10-CM | POA: Diagnosis not present

## 2017-07-24 DIAGNOSIS — E782 Mixed hyperlipidemia: Secondary | ICD-10-CM

## 2017-07-24 LAB — LIPID PANEL
Chol/HDL Ratio: 3.8 ratio (ref 0.0–5.0)
Cholesterol, Total: 137 mg/dL (ref 100–199)
HDL: 36 mg/dL — ABNORMAL LOW (ref >39)
LDL CALC: 78 mg/dL (ref 0–99)
Triglycerides: 115 mg/dL (ref 0–149)
VLDL CHOLESTEROL CAL: 23 mg/dL (ref 5–40)

## 2017-07-24 MED ORDER — ROSUVASTATIN CALCIUM 20 MG PO TABS: 20.0000 mg | ORAL_TABLET | Freq: Every day | ORAL | 3 refills | Status: DC

## 2017-07-24 NOTE — Telephone Encounter (Signed)
-----   Message from Park Liter, MD sent at 07/24/2017  9:00 AM EDT ----- Chol still not perfect, double crestor, flp ast alt in 6 weeks

## 2017-07-24 NOTE — Telephone Encounter (Signed)
Patient informed of results. Advised patient to increase crestor from 10 mg to 20 mg daily. Sent in new prescription. Advised patient to get follow up lab work drawn at the [facility] in [place] in 6 weeks (the week of 09/04/2017). Patient verbalized understanding. No further questions.

## 2017-08-07 DIAGNOSIS — C61 Malignant neoplasm of prostate: Secondary | ICD-10-CM | POA: Diagnosis not present

## 2017-08-12 DIAGNOSIS — C7951 Secondary malignant neoplasm of bone: Secondary | ICD-10-CM | POA: Diagnosis not present

## 2017-08-12 DIAGNOSIS — C61 Malignant neoplasm of prostate: Secondary | ICD-10-CM | POA: Diagnosis not present

## 2017-09-01 DIAGNOSIS — Z6832 Body mass index (BMI) 32.0-32.9, adult: Secondary | ICD-10-CM | POA: Diagnosis not present

## 2017-09-01 DIAGNOSIS — J209 Acute bronchitis, unspecified: Secondary | ICD-10-CM | POA: Diagnosis not present

## 2017-09-04 DIAGNOSIS — E782 Mixed hyperlipidemia: Secondary | ICD-10-CM | POA: Diagnosis not present

## 2017-09-04 DIAGNOSIS — I10 Essential (primary) hypertension: Secondary | ICD-10-CM | POA: Diagnosis not present

## 2017-09-04 LAB — ALT: ALT: 14 IU/L (ref 0–44)

## 2017-09-04 LAB — AST: AST: 17 IU/L (ref 0–40)

## 2017-09-05 DIAGNOSIS — C61 Malignant neoplasm of prostate: Secondary | ICD-10-CM | POA: Diagnosis not present

## 2017-09-09 DIAGNOSIS — C61 Malignant neoplasm of prostate: Secondary | ICD-10-CM | POA: Diagnosis not present

## 2017-09-09 DIAGNOSIS — C7951 Secondary malignant neoplasm of bone: Secondary | ICD-10-CM | POA: Diagnosis not present

## 2017-09-14 DIAGNOSIS — M79671 Pain in right foot: Secondary | ICD-10-CM | POA: Diagnosis not present

## 2017-09-17 DIAGNOSIS — E1165 Type 2 diabetes mellitus with hyperglycemia: Secondary | ICD-10-CM | POA: Diagnosis not present

## 2017-09-25 DIAGNOSIS — E1129 Type 2 diabetes mellitus with other diabetic kidney complication: Secondary | ICD-10-CM | POA: Diagnosis not present

## 2017-09-25 DIAGNOSIS — Z1339 Encounter for screening examination for other mental health and behavioral disorders: Secondary | ICD-10-CM | POA: Diagnosis not present

## 2017-09-25 DIAGNOSIS — Z6832 Body mass index (BMI) 32.0-32.9, adult: Secondary | ICD-10-CM | POA: Diagnosis not present

## 2017-09-30 DIAGNOSIS — C61 Malignant neoplasm of prostate: Secondary | ICD-10-CM | POA: Diagnosis not present

## 2017-10-07 DIAGNOSIS — C7951 Secondary malignant neoplasm of bone: Secondary | ICD-10-CM | POA: Diagnosis not present

## 2017-10-07 DIAGNOSIS — E538 Deficiency of other specified B group vitamins: Secondary | ICD-10-CM | POA: Diagnosis not present

## 2017-10-07 DIAGNOSIS — C61 Malignant neoplasm of prostate: Secondary | ICD-10-CM | POA: Diagnosis not present

## 2017-10-07 DIAGNOSIS — R972 Elevated prostate specific antigen [PSA]: Secondary | ICD-10-CM | POA: Diagnosis not present

## 2017-10-07 DIAGNOSIS — D649 Anemia, unspecified: Secondary | ICD-10-CM | POA: Diagnosis not present

## 2017-11-04 DIAGNOSIS — C7951 Secondary malignant neoplasm of bone: Secondary | ICD-10-CM | POA: Diagnosis not present

## 2017-11-04 DIAGNOSIS — C61 Malignant neoplasm of prostate: Secondary | ICD-10-CM | POA: Diagnosis not present

## 2017-11-06 DIAGNOSIS — C61 Malignant neoplasm of prostate: Secondary | ICD-10-CM | POA: Diagnosis not present

## 2017-11-06 DIAGNOSIS — R351 Nocturia: Secondary | ICD-10-CM | POA: Diagnosis not present

## 2017-12-02 DIAGNOSIS — C7951 Secondary malignant neoplasm of bone: Secondary | ICD-10-CM | POA: Diagnosis not present

## 2017-12-02 DIAGNOSIS — C61 Malignant neoplasm of prostate: Secondary | ICD-10-CM | POA: Diagnosis not present

## 2018-01-13 DIAGNOSIS — C7951 Secondary malignant neoplasm of bone: Secondary | ICD-10-CM | POA: Diagnosis not present

## 2018-01-13 DIAGNOSIS — R9721 Rising PSA following treatment for malignant neoplasm of prostate: Secondary | ICD-10-CM | POA: Diagnosis not present

## 2018-01-13 DIAGNOSIS — C61 Malignant neoplasm of prostate: Secondary | ICD-10-CM | POA: Diagnosis not present

## 2018-02-10 ENCOUNTER — Other Ambulatory Visit (HOSPITAL_COMMUNITY): Payer: Self-pay | Admitting: Hematology and Oncology

## 2018-02-10 DIAGNOSIS — C7651 Malignant neoplasm of right lower limb: Secondary | ICD-10-CM | POA: Diagnosis not present

## 2018-02-10 DIAGNOSIS — C61 Malignant neoplasm of prostate: Secondary | ICD-10-CM | POA: Diagnosis not present

## 2018-02-12 DIAGNOSIS — N3289 Other specified disorders of bladder: Secondary | ICD-10-CM | POA: Diagnosis not present

## 2018-02-12 DIAGNOSIS — C61 Malignant neoplasm of prostate: Secondary | ICD-10-CM | POA: Diagnosis not present

## 2018-02-16 DIAGNOSIS — I10 Essential (primary) hypertension: Secondary | ICD-10-CM | POA: Diagnosis not present

## 2018-02-16 DIAGNOSIS — Z6831 Body mass index (BMI) 31.0-31.9, adult: Secondary | ICD-10-CM | POA: Diagnosis not present

## 2018-02-16 DIAGNOSIS — E785 Hyperlipidemia, unspecified: Secondary | ICD-10-CM | POA: Diagnosis not present

## 2018-02-16 DIAGNOSIS — E1165 Type 2 diabetes mellitus with hyperglycemia: Secondary | ICD-10-CM | POA: Diagnosis not present

## 2018-02-16 DIAGNOSIS — Z79899 Other long term (current) drug therapy: Secondary | ICD-10-CM | POA: Diagnosis not present

## 2018-02-16 DIAGNOSIS — Z0000 Encounter for general adult medical examination without abnormal findings: Secondary | ICD-10-CM | POA: Diagnosis not present

## 2018-02-16 DIAGNOSIS — M1990 Unspecified osteoarthritis, unspecified site: Secondary | ICD-10-CM | POA: Diagnosis not present

## 2018-02-16 DIAGNOSIS — Z23 Encounter for immunization: Secondary | ICD-10-CM | POA: Diagnosis not present

## 2018-02-16 DIAGNOSIS — Z1331 Encounter for screening for depression: Secondary | ICD-10-CM | POA: Diagnosis not present

## 2018-02-16 DIAGNOSIS — R5383 Other fatigue: Secondary | ICD-10-CM | POA: Diagnosis not present

## 2018-02-16 DIAGNOSIS — Z9181 History of falling: Secondary | ICD-10-CM | POA: Diagnosis not present

## 2018-02-24 ENCOUNTER — Ambulatory Visit (HOSPITAL_COMMUNITY)
Admission: RE | Admit: 2018-02-24 | Discharge: 2018-02-24 | Disposition: A | Discharge status: Home / Self Care | Payer: PPO | Source: Ambulatory Visit | Attending: Hematology and Oncology | Admitting: Hematology and Oncology

## 2018-02-24 DIAGNOSIS — C61 Malignant neoplasm of prostate: Secondary | ICD-10-CM | POA: Diagnosis not present

## 2018-02-24 DIAGNOSIS — C7951 Secondary malignant neoplasm of bone: Secondary | ICD-10-CM | POA: Diagnosis not present

## 2018-02-24 MED ORDER — AXUMIN (FLUCICLOVINE F 18) INJECTION
7.5700 | Freq: Once | INTRAVENOUS | Status: AC
  Administered 2018-02-24: 7.57 via INTRAVENOUS

## 2018-02-27 DIAGNOSIS — R9721 Rising PSA following treatment for malignant neoplasm of prostate: Secondary | ICD-10-CM | POA: Diagnosis not present

## 2018-02-27 DIAGNOSIS — C7951 Secondary malignant neoplasm of bone: Secondary | ICD-10-CM | POA: Diagnosis not present

## 2018-02-27 DIAGNOSIS — D649 Anemia, unspecified: Secondary | ICD-10-CM

## 2018-02-27 DIAGNOSIS — Z79899 Other long term (current) drug therapy: Secondary | ICD-10-CM | POA: Diagnosis not present

## 2018-02-27 DIAGNOSIS — C61 Malignant neoplasm of prostate: Secondary | ICD-10-CM | POA: Diagnosis not present

## 2018-03-03 ENCOUNTER — Ambulatory Visit (INDEPENDENT_AMBULATORY_CARE_PROVIDER_SITE_OTHER): Payer: PPO | Admitting: Cardiology

## 2018-03-03 ENCOUNTER — Encounter: Payer: Self-pay | Admitting: Cardiology

## 2018-03-03 VITALS — BP 130/76 | HR 65 | Ht 68.0 in | Wt 210.8 lb

## 2018-03-03 DIAGNOSIS — C771 Secondary and unspecified malignant neoplasm of intrathoracic lymph nodes: Secondary | ICD-10-CM | POA: Diagnosis not present

## 2018-03-03 DIAGNOSIS — C61 Malignant neoplasm of prostate: Secondary | ICD-10-CM | POA: Diagnosis not present

## 2018-03-03 DIAGNOSIS — E782 Mixed hyperlipidemia: Secondary | ICD-10-CM

## 2018-03-03 DIAGNOSIS — I2510 Atherosclerotic heart disease of native coronary artery without angina pectoris: Secondary | ICD-10-CM | POA: Diagnosis not present

## 2018-03-03 DIAGNOSIS — C7951 Secondary malignant neoplasm of bone: Secondary | ICD-10-CM | POA: Diagnosis not present

## 2018-03-03 DIAGNOSIS — E119 Type 2 diabetes mellitus without complications: Secondary | ICD-10-CM

## 2018-03-03 DIAGNOSIS — I10 Essential (primary) hypertension: Secondary | ICD-10-CM | POA: Diagnosis not present

## 2018-03-03 MED ORDER — NITROGLYCERIN 0.4 MG SL SUBL: 0.4000 mg | SUBLINGUAL_TABLET | SUBLINGUAL | 6 refills | Status: AC | PRN

## 2018-03-03 NOTE — Patient Instructions (Signed)
Medication Instructions:  Your physician recommends that you continue on your current medications as directed. Please refer to the Current Medication list given to you today.  If you need a refill on your cardiac medications before your next appointment, please call your pharmacy.   Lab work: None ordered  If you have labs (blood work) drawn today and your tests are completely normal, you will receive your results only by: Marland Kitchen MyChart Message (if you have MyChart) OR . A paper copy in the mail If you have any lab test that is abnormal or we need to change your treatment, we will call you to review the results.  Testing/Procedures: None ordered  Follow-Up: At [facility], you and your health needs are our priority.  As part of our continuing mission to provide you with exceptional heart care, we have created designated Provider Care Teams.  These Care Teams include your primary Cardiologist (physician) and Advanced Practice Providers (APPs -  Physician Assistants and Nurse Practitioners) who all work together to provide you with the care you need, when you need it. You will need a follow up appointment in 6 months.  Please call our office 2 months in advance to schedule this appointment.  You may see Jenne Campus, MD or another member of our [place] Provider Team in [place]: Shirlee More, MD . Jyl Heinz, MD  Any Other Special Instructions Will Be Listed Below (If Applicable).

## 2018-03-03 NOTE — Progress Notes (Signed)
Cardiology Office Note:    Date:  03/03/2018   ID:  Anabel Halon, DOB Feb 01, 1951, MRN [id number]  PCP:  Angelina Sheriff, MD  Cardiologist:  Jenne Campus, MD    Referring MD: Angelina Sheriff, MD   Chief Complaint  Patient presents with  . Follow-up  Doing very well  History of Present Illness:    Perry Wolfe is a 67 y.o. male with coronary artery disease status post drug-eluting stent to obtuse marginal 1 done in 2013.  Since that time he is doing well no chest pain tightness squeezing pressure burning chest when the summer as well as full-time he worked hard in the garden and have no difficulty doing it sadness is the fact there is some problem with his prostate cancer he required some extra medications he is being followed by our oncology team which is very good.  Denies have any chest pain tightness squeezing pressure burning chest.  Past Medical History:  Diagnosis Date  . Cancer ([place])   . Diabetes mellitus without complication ([place])   . Hx of radiation therapy   . Hyperlipidemia   . Hypertension       Current Medications: Current Meds  Medication Sig  . aspirin EC 81 MG tablet Take 81 mg by mouth every other day.   . Calcium Citrate-Vitamin D (CALCIUM CITRATE CHEWY BITE PO) Take 650 mg by mouth 2 (two) times daily.  . cyanocobalamin 2000 MCG tablet Take 2,000 mcg by mouth daily.  Marland Kitchen lisinopril (PRINIVIL,ZESTRIL) 5 MG tablet Take 5 mg by mouth daily.   . metformin (FORTAMET) 500 MG (OSM) 24 hr tablet Take 2,000 mg by mouth at bedtime.   . metoprolol succinate (TOPROL-XL) 25 MG 24 hr tablet Take 25 mg by mouth daily.  . nitroGLYCERIN (NITROSTAT) 0.4 MG SL tablet Place 0.4 mg under the tongue as needed for chest pain.  Marland Kitchen omeprazole (PRILOSEC) 20 MG capsule Take 20 mg by mouth daily.  . rosuvastatin (CRESTOR) 20 MG tablet Take 1 tablet (20 mg total) by mouth daily.  . tamsulosin (FLOMAX) 0.4 MG CAPS capsule Take 0.8 mg by mouth daily.   Marland Kitchen UNABLE TO FIND Med  Name: [id number]     Allergies:   Patient has no known allergies.   Social History   Socioeconomic History  . Marital status: Married    Spouse name: Not on file  . Number of children: Not on file  . Years of education: Not on file  . Highest education level: Not on file  Occupational History  . Not on file  Social Needs  . Financial resource strain: Not on file  . Food insecurity:    Worry: Not on file    Inability: Not on file  . Transportation needs:    Medical: Not on file    Non-medical: Not on file  Tobacco Use  . Smoking status: Former Research scientist (life sciences)  . Smokeless tobacco: Never Used  Substance and Sexual Activity  . Alcohol use: Yes  . Drug use: No  . Sexual activity: Not on file  Lifestyle  . Physical activity:    Days per week: Not on file    Minutes per session: Not on file  . Stress: Not on file  Relationships  . Social connections:    Talks on phone: Not on file    Gets together: Not on file    Attends religious service: Not on file    Active member of club or organization: Not  on file    Attends meetings of clubs or organizations: Not on file    Relationship status: Not on file  Other Topics Concern  . Not on file  Social History Narrative  . Not on file     Family History: The patient's family history includes Lung cancer in his father; Stroke in his father. ROS:   Please see the history of present illness.    All 14 point review of systems negative except as described per history of present illness  EKGs/Labs/Other Studies Reviewed:      Recent Labs: 09/04/2017: ALT 14  Recent Lipid Panel    Component Value Date/Time   CHOL 137 07/23/2017 0825   TRIG 115 07/23/2017 0825   HDL 36 (L) 07/23/2017 0825   CHOLHDL 3.8 07/23/2017 0825   LDLCALC 78 07/23/2017 0825    Physical Exam:    VS:  BP 130/76   Pulse 65   Ht 5\' 8"  (1.727 m)   Wt 210 lb 12.8 oz (95.6 kg)   SpO2 96%   BMI 32.05 kg/m     Wt Readings from Last 3 Encounters:  03/03/18 210 lb  12.8 oz (95.6 kg)  07/23/17 208 lb 6.4 oz (94.5 kg)  01/23/17 208 lb 1.9 oz (94.4 kg)     GEN:  Well nourished, well developed in no acute distress HEENT: Normal NECK: No JVD; No carotid bruits LYMPHATICS: No lymphadenopathy CARDIAC: RRR, no murmurs, no rubs, no gallops RESPIRATORY:  Clear to auscultation without rales, wheezing or rhonchi  ABDOMEN: Soft, non-tender, non-distended MUSCULOSKELETAL:  No edema; No deformity  SKIN: Warm and dry LOWER EXTREMITIES: no swelling NEUROLOGIC:  Alert and oriented x 3 PSYCHIATRIC:  Normal affect   ASSESSMENT:    1. Coronary artery disease involving native coronary artery of native heart without angina pectoris   2. Essential hypertension   3. Type 2 diabetes mellitus without complication, without long-term current use of insulin ([place])   4. Mixed hyperlipidemia    PLAN:    In order of problems listed above:  1. Coronary artery disease stable on appropriate medications which I will continue. 2. Essential hypertension blood pressure well controlled continue present management. 3. Type 2 diabetes doing well from that point review apparently recently metformin has been increased sugars better but he is not tolerating this medication well he is thinking about temporarily cutting down and see if he feels better at that he will talk to his primary care physician about updating his medications. 4. Dyslipidemia he is on cholesterol medication which is Crestor 20 which I will continue.  Last fasting lipid profile was excellent.   Medication Adjustments/Labs and Tests Ordered: Current medicines are reviewed at length with the patient today.  Concerns regarding medicines are outlined above.  No orders of the defined types were placed in this encounter.  Medication changes: No orders of the defined types were placed in this encounter.   Signed, Park Liter, MD, [facility] 03/03/2018 9:18 AM    [place]

## 2018-03-10 DIAGNOSIS — E559 Vitamin D deficiency, unspecified: Secondary | ICD-10-CM

## 2018-03-10 DIAGNOSIS — C61 Malignant neoplasm of prostate: Secondary | ICD-10-CM | POA: Diagnosis not present

## 2018-03-10 DIAGNOSIS — Z79899 Other long term (current) drug therapy: Secondary | ICD-10-CM | POA: Diagnosis not present

## 2018-03-10 DIAGNOSIS — C7951 Secondary malignant neoplasm of bone: Secondary | ICD-10-CM | POA: Diagnosis not present

## 2018-03-24 DIAGNOSIS — E559 Vitamin D deficiency, unspecified: Secondary | ICD-10-CM | POA: Diagnosis not present

## 2018-03-24 DIAGNOSIS — C7951 Secondary malignant neoplasm of bone: Secondary | ICD-10-CM | POA: Diagnosis not present

## 2018-03-24 DIAGNOSIS — J069 Acute upper respiratory infection, unspecified: Secondary | ICD-10-CM | POA: Diagnosis not present

## 2018-03-24 DIAGNOSIS — C61 Malignant neoplasm of prostate: Secondary | ICD-10-CM | POA: Diagnosis not present

## 2018-04-10 DIAGNOSIS — C7951 Secondary malignant neoplasm of bone: Secondary | ICD-10-CM | POA: Diagnosis not present

## 2018-04-10 DIAGNOSIS — E559 Vitamin D deficiency, unspecified: Secondary | ICD-10-CM | POA: Diagnosis not present

## 2018-04-10 DIAGNOSIS — C61 Malignant neoplasm of prostate: Secondary | ICD-10-CM | POA: Diagnosis not present

## 2018-04-10 DIAGNOSIS — Z79899 Other long term (current) drug therapy: Secondary | ICD-10-CM | POA: Diagnosis not present

## 2018-05-11 DIAGNOSIS — C771 Secondary and unspecified malignant neoplasm of intrathoracic lymph nodes: Secondary | ICD-10-CM | POA: Diagnosis not present

## 2018-05-11 DIAGNOSIS — E559 Vitamin D deficiency, unspecified: Secondary | ICD-10-CM

## 2018-05-11 DIAGNOSIS — C7951 Secondary malignant neoplasm of bone: Secondary | ICD-10-CM | POA: Diagnosis not present

## 2018-05-11 DIAGNOSIS — C61 Malignant neoplasm of prostate: Secondary | ICD-10-CM | POA: Diagnosis not present

## 2018-05-11 DIAGNOSIS — Z79899 Other long term (current) drug therapy: Secondary | ICD-10-CM | POA: Diagnosis not present

## 2018-05-11 DIAGNOSIS — D649 Anemia, unspecified: Secondary | ICD-10-CM | POA: Diagnosis not present

## 2018-05-13 ENCOUNTER — Other Ambulatory Visit: Payer: Self-pay | Admitting: Emergency Medicine

## 2018-05-13 MED ORDER — ROSUVASTATIN CALCIUM 20 MG PO TABS: 20.0000 mg | ORAL_TABLET | Freq: Every day | ORAL | 1 refills | Status: DC

## 2018-05-21 DIAGNOSIS — N3289 Other specified disorders of bladder: Secondary | ICD-10-CM | POA: Diagnosis not present

## 2018-05-21 DIAGNOSIS — C61 Malignant neoplasm of prostate: Secondary | ICD-10-CM | POA: Diagnosis not present

## 2018-05-26 DIAGNOSIS — Z6831 Body mass index (BMI) 31.0-31.9, adult: Secondary | ICD-10-CM | POA: Diagnosis not present

## 2018-05-26 DIAGNOSIS — E1165 Type 2 diabetes mellitus with hyperglycemia: Secondary | ICD-10-CM | POA: Diagnosis not present

## 2018-05-26 DIAGNOSIS — E7800 Pure hypercholesterolemia, unspecified: Secondary | ICD-10-CM | POA: Diagnosis not present

## 2018-05-26 DIAGNOSIS — I10 Essential (primary) hypertension: Secondary | ICD-10-CM | POA: Diagnosis not present

## 2018-06-08 DIAGNOSIS — C7951 Secondary malignant neoplasm of bone: Secondary | ICD-10-CM | POA: Diagnosis not present

## 2018-06-08 DIAGNOSIS — Z79899 Other long term (current) drug therapy: Secondary | ICD-10-CM | POA: Diagnosis not present

## 2018-06-08 DIAGNOSIS — E559 Vitamin D deficiency, unspecified: Secondary | ICD-10-CM | POA: Diagnosis not present

## 2018-06-08 DIAGNOSIS — C61 Malignant neoplasm of prostate: Secondary | ICD-10-CM | POA: Diagnosis not present

## 2018-06-08 DIAGNOSIS — D649 Anemia, unspecified: Secondary | ICD-10-CM | POA: Diagnosis not present

## 2018-07-07 DIAGNOSIS — C7951 Secondary malignant neoplasm of bone: Secondary | ICD-10-CM | POA: Diagnosis not present

## 2018-07-07 DIAGNOSIS — C61 Malignant neoplasm of prostate: Secondary | ICD-10-CM | POA: Diagnosis not present

## 2018-07-07 DIAGNOSIS — D649 Anemia, unspecified: Secondary | ICD-10-CM | POA: Diagnosis not present

## 2018-08-05 DIAGNOSIS — C61 Malignant neoplasm of prostate: Secondary | ICD-10-CM | POA: Diagnosis not present

## 2018-08-05 DIAGNOSIS — C7951 Secondary malignant neoplasm of bone: Secondary | ICD-10-CM | POA: Diagnosis not present

## 2018-08-05 DIAGNOSIS — C778 Secondary and unspecified malignant neoplasm of lymph nodes of multiple regions: Secondary | ICD-10-CM | POA: Diagnosis not present

## 2018-08-19 DIAGNOSIS — L301 Dyshidrosis [pompholyx]: Secondary | ICD-10-CM | POA: Diagnosis not present

## 2018-08-19 DIAGNOSIS — Z6829 Body mass index (BMI) 29.0-29.9, adult: Secondary | ICD-10-CM | POA: Diagnosis not present

## 2018-09-02 DIAGNOSIS — C7951 Secondary malignant neoplasm of bone: Secondary | ICD-10-CM | POA: Diagnosis not present

## 2018-09-02 DIAGNOSIS — C61 Malignant neoplasm of prostate: Secondary | ICD-10-CM | POA: Diagnosis not present

## 2018-09-18 DIAGNOSIS — C61 Malignant neoplasm of prostate: Secondary | ICD-10-CM | POA: Diagnosis not present

## 2018-09-18 DIAGNOSIS — N3289 Other specified disorders of bladder: Secondary | ICD-10-CM | POA: Diagnosis not present

## 2018-09-30 DIAGNOSIS — C7951 Secondary malignant neoplasm of bone: Secondary | ICD-10-CM | POA: Diagnosis not present

## 2018-09-30 DIAGNOSIS — C61 Malignant neoplasm of prostate: Secondary | ICD-10-CM | POA: Diagnosis not present

## 2018-10-14 DIAGNOSIS — M549 Dorsalgia, unspecified: Secondary | ICD-10-CM | POA: Diagnosis not present

## 2018-10-14 DIAGNOSIS — M546 Pain in thoracic spine: Secondary | ICD-10-CM | POA: Diagnosis not present

## 2018-10-14 DIAGNOSIS — C7951 Secondary malignant neoplasm of bone: Secondary | ICD-10-CM | POA: Diagnosis not present

## 2018-10-14 DIAGNOSIS — C61 Malignant neoplasm of prostate: Secondary | ICD-10-CM | POA: Diagnosis not present

## 2018-10-28 DIAGNOSIS — C61 Malignant neoplasm of prostate: Secondary | ICD-10-CM | POA: Diagnosis not present

## 2018-10-28 DIAGNOSIS — D649 Anemia, unspecified: Secondary | ICD-10-CM | POA: Diagnosis not present

## 2018-10-28 DIAGNOSIS — C7951 Secondary malignant neoplasm of bone: Secondary | ICD-10-CM | POA: Diagnosis not present

## 2018-10-28 DIAGNOSIS — C771 Secondary and unspecified malignant neoplasm of intrathoracic lymph nodes: Secondary | ICD-10-CM | POA: Diagnosis not present

## 2018-10-29 DIAGNOSIS — M5415 Radiculopathy, thoracolumbar region: Secondary | ICD-10-CM | POA: Diagnosis not present

## 2018-10-29 DIAGNOSIS — C61 Malignant neoplasm of prostate: Secondary | ICD-10-CM | POA: Diagnosis not present

## 2018-10-29 DIAGNOSIS — C7951 Secondary malignant neoplasm of bone: Secondary | ICD-10-CM | POA: Diagnosis not present

## 2018-10-29 DIAGNOSIS — M5124 Other intervertebral disc displacement, thoracic region: Secondary | ICD-10-CM | POA: Diagnosis not present

## 2018-10-29 DIAGNOSIS — Z510 Encounter for antineoplastic radiation therapy: Secondary | ICD-10-CM | POA: Diagnosis not present

## 2018-10-29 DIAGNOSIS — M48061 Spinal stenosis, lumbar region without neurogenic claudication: Secondary | ICD-10-CM | POA: Diagnosis not present

## 2018-10-29 DIAGNOSIS — M5126 Other intervertebral disc displacement, lumbar region: Secondary | ICD-10-CM | POA: Diagnosis not present

## 2018-10-29 DIAGNOSIS — M5384 Other specified dorsopathies, thoracic region: Secondary | ICD-10-CM | POA: Diagnosis not present

## 2018-10-30 DIAGNOSIS — C7951 Secondary malignant neoplasm of bone: Secondary | ICD-10-CM | POA: Diagnosis not present

## 2018-10-30 DIAGNOSIS — C61 Malignant neoplasm of prostate: Secondary | ICD-10-CM | POA: Diagnosis not present

## 2018-10-30 DIAGNOSIS — Z510 Encounter for antineoplastic radiation therapy: Secondary | ICD-10-CM | POA: Diagnosis not present

## 2018-11-02 DIAGNOSIS — C7951 Secondary malignant neoplasm of bone: Secondary | ICD-10-CM | POA: Diagnosis not present

## 2018-11-03 DIAGNOSIS — C61 Malignant neoplasm of prostate: Secondary | ICD-10-CM | POA: Diagnosis not present

## 2018-11-03 DIAGNOSIS — C7951 Secondary malignant neoplasm of bone: Secondary | ICD-10-CM | POA: Diagnosis not present

## 2018-11-03 DIAGNOSIS — Z510 Encounter for antineoplastic radiation therapy: Secondary | ICD-10-CM | POA: Diagnosis not present

## 2018-11-04 DIAGNOSIS — C7951 Secondary malignant neoplasm of bone: Secondary | ICD-10-CM | POA: Diagnosis not present

## 2018-11-05 DIAGNOSIS — C7951 Secondary malignant neoplasm of bone: Secondary | ICD-10-CM | POA: Diagnosis not present

## 2018-11-06 DIAGNOSIS — C7951 Secondary malignant neoplasm of bone: Secondary | ICD-10-CM | POA: Diagnosis not present

## 2018-11-07 ENCOUNTER — Other Ambulatory Visit: Payer: Self-pay | Admitting: Cardiology

## 2018-11-09 DIAGNOSIS — C7951 Secondary malignant neoplasm of bone: Secondary | ICD-10-CM | POA: Diagnosis not present

## 2018-11-10 DIAGNOSIS — C7951 Secondary malignant neoplasm of bone: Secondary | ICD-10-CM | POA: Diagnosis not present

## 2018-11-11 DIAGNOSIS — C7951 Secondary malignant neoplasm of bone: Secondary | ICD-10-CM | POA: Diagnosis not present

## 2018-11-12 DIAGNOSIS — C61 Malignant neoplasm of prostate: Secondary | ICD-10-CM | POA: Diagnosis not present

## 2018-11-12 DIAGNOSIS — D72829 Elevated white blood cell count, unspecified: Secondary | ICD-10-CM | POA: Diagnosis not present

## 2018-11-12 DIAGNOSIS — C7951 Secondary malignant neoplasm of bone: Secondary | ICD-10-CM | POA: Diagnosis not present

## 2018-11-13 DIAGNOSIS — C61 Malignant neoplasm of prostate: Secondary | ICD-10-CM | POA: Diagnosis not present

## 2018-11-13 DIAGNOSIS — C7951 Secondary malignant neoplasm of bone: Secondary | ICD-10-CM | POA: Diagnosis not present

## 2018-11-13 DIAGNOSIS — Z510 Encounter for antineoplastic radiation therapy: Secondary | ICD-10-CM | POA: Diagnosis not present

## 2018-11-18 DIAGNOSIS — E7800 Pure hypercholesterolemia, unspecified: Secondary | ICD-10-CM | POA: Diagnosis not present

## 2018-11-18 DIAGNOSIS — E1159 Type 2 diabetes mellitus with other circulatory complications: Secondary | ICD-10-CM | POA: Diagnosis not present

## 2018-11-18 DIAGNOSIS — Z6828 Body mass index (BMI) 28.0-28.9, adult: Secondary | ICD-10-CM | POA: Diagnosis not present

## 2018-11-18 DIAGNOSIS — I10 Essential (primary) hypertension: Secondary | ICD-10-CM | POA: Diagnosis not present

## 2018-11-18 DIAGNOSIS — E1129 Type 2 diabetes mellitus with other diabetic kidney complication: Secondary | ICD-10-CM | POA: Diagnosis not present

## 2018-11-18 DIAGNOSIS — E785 Hyperlipidemia, unspecified: Secondary | ICD-10-CM | POA: Diagnosis not present

## 2018-11-25 DIAGNOSIS — Z7901 Long term (current) use of anticoagulants: Secondary | ICD-10-CM | POA: Diagnosis not present

## 2018-11-25 DIAGNOSIS — D649 Anemia, unspecified: Secondary | ICD-10-CM | POA: Diagnosis not present

## 2018-11-25 DIAGNOSIS — C61 Malignant neoplasm of prostate: Secondary | ICD-10-CM | POA: Diagnosis not present

## 2018-11-25 DIAGNOSIS — C7951 Secondary malignant neoplasm of bone: Secondary | ICD-10-CM | POA: Diagnosis not present

## 2018-11-30 ENCOUNTER — Telehealth: Payer: Self-pay | Admitting: Cardiology

## 2018-11-30 NOTE — Telephone Encounter (Signed)
Please call patient regarding medications and his chemotherapy meds.

## 2018-12-01 DIAGNOSIS — I10 Essential (primary) hypertension: Secondary | ICD-10-CM | POA: Diagnosis not present

## 2018-12-01 DIAGNOSIS — C7951 Secondary malignant neoplasm of bone: Secondary | ICD-10-CM | POA: Diagnosis not present

## 2018-12-01 DIAGNOSIS — C61 Malignant neoplasm of prostate: Secondary | ICD-10-CM | POA: Diagnosis not present

## 2018-12-01 DIAGNOSIS — Z5111 Encounter for antineoplastic chemotherapy: Secondary | ICD-10-CM | POA: Diagnosis not present

## 2018-12-01 DIAGNOSIS — I2510 Atherosclerotic heart disease of native coronary artery without angina pectoris: Secondary | ICD-10-CM | POA: Diagnosis not present

## 2018-12-01 DIAGNOSIS — D649 Anemia, unspecified: Secondary | ICD-10-CM | POA: Diagnosis not present

## 2018-12-01 DIAGNOSIS — E119 Type 2 diabetes mellitus without complications: Secondary | ICD-10-CM | POA: Diagnosis not present

## 2018-12-01 NOTE — Telephone Encounter (Signed)
Patient is going to have to have to start chemo tomorrow TAXOTERE (DOCETAXEL)  and PEGFILGRASTIM (NEULASTA) and wants to make sure that it is okay for him to take this along with cardiac meds. I will consult with Dr. Agustin Cree and follow up with patient. He also wants to make sure it wouldn't cause a issue with his stents as well.

## 2018-12-01 NOTE — Telephone Encounter (Signed)
Left message for patient to return call.

## 2018-12-02 DIAGNOSIS — D649 Anemia, unspecified: Secondary | ICD-10-CM | POA: Diagnosis not present

## 2018-12-02 DIAGNOSIS — C61 Malignant neoplasm of prostate: Secondary | ICD-10-CM | POA: Diagnosis not present

## 2018-12-02 DIAGNOSIS — Z5111 Encounter for antineoplastic chemotherapy: Secondary | ICD-10-CM | POA: Diagnosis not present

## 2018-12-02 DIAGNOSIS — C7951 Secondary malignant neoplasm of bone: Secondary | ICD-10-CM | POA: Diagnosis not present

## 2018-12-02 NOTE — Telephone Encounter (Signed)
Should be ok

## 2018-12-02 NOTE — Telephone Encounter (Signed)
Patient informed of Dr. Krasowski's recommendation.  °

## 2018-12-03 DIAGNOSIS — C61 Malignant neoplasm of prostate: Secondary | ICD-10-CM | POA: Diagnosis not present

## 2018-12-03 DIAGNOSIS — C7951 Secondary malignant neoplasm of bone: Secondary | ICD-10-CM | POA: Diagnosis not present

## 2018-12-03 DIAGNOSIS — Z5189 Encounter for other specified aftercare: Secondary | ICD-10-CM | POA: Diagnosis not present

## 2018-12-11 DIAGNOSIS — C7951 Secondary malignant neoplasm of bone: Secondary | ICD-10-CM | POA: Diagnosis not present

## 2018-12-11 DIAGNOSIS — D649 Anemia, unspecified: Secondary | ICD-10-CM | POA: Diagnosis not present

## 2018-12-11 DIAGNOSIS — C61 Malignant neoplasm of prostate: Secondary | ICD-10-CM | POA: Diagnosis not present

## 2018-12-25 DIAGNOSIS — C7951 Secondary malignant neoplasm of bone: Secondary | ICD-10-CM | POA: Diagnosis not present

## 2018-12-25 DIAGNOSIS — R300 Dysuria: Secondary | ICD-10-CM | POA: Diagnosis not present

## 2018-12-25 DIAGNOSIS — C61 Malignant neoplasm of prostate: Secondary | ICD-10-CM | POA: Diagnosis not present

## 2018-12-28 DIAGNOSIS — Z5189 Encounter for other specified aftercare: Secondary | ICD-10-CM | POA: Diagnosis not present

## 2018-12-28 DIAGNOSIS — C7951 Secondary malignant neoplasm of bone: Secondary | ICD-10-CM | POA: Diagnosis not present

## 2018-12-28 DIAGNOSIS — C61 Malignant neoplasm of prostate: Secondary | ICD-10-CM | POA: Diagnosis not present

## 2018-12-29 DIAGNOSIS — N3289 Other specified disorders of bladder: Secondary | ICD-10-CM | POA: Diagnosis not present

## 2018-12-29 DIAGNOSIS — C61 Malignant neoplasm of prostate: Secondary | ICD-10-CM | POA: Diagnosis not present

## 2019-01-13 DIAGNOSIS — C61 Malignant neoplasm of prostate: Secondary | ICD-10-CM | POA: Diagnosis not present

## 2019-01-13 DIAGNOSIS — D649 Anemia, unspecified: Secondary | ICD-10-CM | POA: Diagnosis not present

## 2019-01-13 DIAGNOSIS — Z23 Encounter for immunization: Secondary | ICD-10-CM | POA: Diagnosis not present

## 2019-01-13 DIAGNOSIS — C7951 Secondary malignant neoplasm of bone: Secondary | ICD-10-CM | POA: Diagnosis not present

## 2019-01-14 DIAGNOSIS — C7951 Secondary malignant neoplasm of bone: Secondary | ICD-10-CM | POA: Diagnosis not present

## 2019-01-14 DIAGNOSIS — C61 Malignant neoplasm of prostate: Secondary | ICD-10-CM | POA: Diagnosis not present

## 2019-01-14 DIAGNOSIS — Z5189 Encounter for other specified aftercare: Secondary | ICD-10-CM | POA: Diagnosis not present

## 2019-01-15 ENCOUNTER — Other Ambulatory Visit: Payer: Self-pay

## 2019-01-15 ENCOUNTER — Ambulatory Visit (INDEPENDENT_AMBULATORY_CARE_PROVIDER_SITE_OTHER): Payer: PPO | Admitting: Cardiology

## 2019-01-15 ENCOUNTER — Encounter: Payer: Self-pay | Admitting: Cardiology

## 2019-01-15 VITALS — BP 104/58 | HR 72 | Ht 68.0 in | Wt 199.0 lb

## 2019-01-15 DIAGNOSIS — I2510 Atherosclerotic heart disease of native coronary artery without angina pectoris: Secondary | ICD-10-CM | POA: Diagnosis not present

## 2019-01-15 DIAGNOSIS — I10 Essential (primary) hypertension: Secondary | ICD-10-CM | POA: Diagnosis not present

## 2019-01-15 DIAGNOSIS — E782 Mixed hyperlipidemia: Secondary | ICD-10-CM

## 2019-01-15 DIAGNOSIS — E119 Type 2 diabetes mellitus without complications: Secondary | ICD-10-CM

## 2019-01-15 NOTE — Patient Instructions (Signed)
Medication Instructions:  Your physician recommends that you continue on your current medications as directed. Please refer to the Current Medication list given to you today.  If you need a refill on your cardiac medications before your next appointment, please call your pharmacy.   Lab work: None ordered If you have labs (blood work) drawn today and your tests are completely normal, you will receive your results only by: Marland Kitchen MyChart Message (if you have MyChart) OR . A paper copy in the mail If you have any lab test that is abnormal or we need to change your treatment, we will call you to review the results.  Testing/Procedures: Your physician has requested that you have an echocardiogram. Echocardiography is a painless test that uses sound waves to create images of your heart. It provides your doctor with information about the size and shape of your heart and how well your heart's chambers and valves are working. This procedure takes approximately one hour. There are no restrictions for this procedure.  Follow-Up: At [facility], you and your health needs are our priority.  As part of our continuing mission to provide you with exceptional heart care, we have created designated Provider Care Teams.  These Care Teams include your primary Cardiologist (physician) and Advanced Practice Providers (APPs -  Physician Assistants and Nurse Practitioners) who all work together to provide you with the care you need, when you need it. You will need a follow up appointment in 5 months.  Please call our office 2 months in advance to schedule this appointment.  You may see Jenne Campus, MD or another member of our [place] Provider Team in [place]: Shirlee More, MD . Jyl Heinz, MD  Any Other Special Instructions Will Be Listed Below (If Applicable). We will call you or send you a letter to schedule your follow up appointment

## 2019-01-15 NOTE — Progress Notes (Signed)
Cardiology Office Note:    Date:  01/15/2019   ID:  Perry Wolfe, DOB [birth year], MRN [id number]  PCP:  Angelina Sheriff, MD  Cardiologist:  Jenne Campus, MD    Referring MD: Angelina Sheriff, MD   Chief Complaint  Patient presents with  . Follow-up  Doing well  History of Present Illness:    Perry Wolfe is a 68 y.o. male with history of coronary artery disease, hypertension, type 2 diabetes, dyslipidemia.  Today to my office for follow-up.  He was find to have prostate cancer.  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  He seems to be happy optimistic described to have some shortness of breath with exertion but no chest pain no tightness no pressure no burning no squeezing the chest.  Past Medical History:  Diagnosis Date  . Cancer ([place])   . Diabetes mellitus without complication ([place])   . Hx of radiation therapy   . Hyperlipidemia   . Hypertension       Current Medications: Current Meds  Medication Sig  . aspirin EC 81 MG tablet Take 81 mg by mouth every other day.   . Calcium Citrate-Vitamin D (CALCIUM CITRATE CHEWY BITE PO) Take 650 mg by mouth 2 (two) times daily.  . cyanocobalamin 2000 MCG tablet Take 2,000 mcg by mouth daily.  Marland Kitchen dexamethasone (DECADRON) 4 MG tablet Take 2 tablets by mouth. For 3 days after chemo  . JARDIANCE 10 MG TABS tablet Take 1 tablet by mouth daily.  Marland Kitchen lisinopril (PRINIVIL,ZESTRIL) 5 MG tablet Take 5 mg by mouth daily.   . metformin (FORTAMET) 500 MG (OSM) 24 hr tablet Take 2,000 mg by mouth at bedtime.   . metoprolol succinate (TOPROL-XL) 25 MG 24 hr tablet Take 25 mg by mouth daily.  . nitroGLYCERIN (NITROSTAT) 0.4 MG SL tablet Place 1 tablet (0.4 mg total) under the tongue as needed for chest pain.  Marland Kitchen omeprazole (PRILOSEC) 20 MG capsule Take 20 mg by mouth daily.  . predniSONE (DELTASONE) 5 MG tablet Take 1 tablet by mouth. Twice a day for 3 days after  chemo  . prochlorperazine (COMPAZINE) 10 MG tablet   . rosuvastatin (CRESTOR) 20 MG tablet 1TD  . tamsulosin (FLOMAX) 0.4 MG CAPS capsule Take 0.8 mg by mouth daily.   Marland Kitchen UNABLE TO FIND Med Name: [id number]     Allergies:   Patient has no known allergies.   Social History   Socioeconomic History  . Marital status: Married    Spouse name: Not on file  . Number of children: Not on file  . Years of education: Not on file  . Highest education level: Not on file  Occupational History  . Not on file  Social Needs  . Financial resource strain: Not on file  . Food insecurity    Worry: Not on file    Inability: Not on file  . Transportation needs    Medical: Not on file    Non-medical: Not on file  Tobacco Use  . Smoking status: Former Research scientist (life sciences)  . Smokeless tobacco: Never Used  Substance and Sexual Activity  . Alcohol use: Yes  . Drug use: No  . Sexual activity: Not on file  Lifestyle  . Physical activity    Days per week: Not on file    Minutes per session: Not on file  . Stress: Not on file  Relationships  . Social connections  Talks on phone: Not on file    Gets together: Not on file    Attends religious service: Not on file    Active member of club or organization: Not on file    Attends meetings of clubs or organizations: Not on file    Relationship status: Not on file  Other Topics Concern  . Not on file  Social History Narrative  . Not on file     Family History: The patient's family history includes Lung cancer in his father; Stroke in his father. ROS:   Please see the history of present illness.    All 14 point review of systems negative except as described per history of present illness  EKGs/Labs/Other Studies Reviewed:      Recent Labs: No results found for requested labs within last 8760 hours.  Recent Lipid Panel    Component Value Date/Time   CHOL 137 07/23/2017 0825   TRIG 115 07/23/2017 0825   HDL 36 (L) 07/23/2017 0825   CHOLHDL 3.8 07/23/2017  0825   LDLCALC 78 07/23/2017 0825    Physical Exam:    VS:  BP (!) 104/58   Pulse 72   Ht 5\' 8"  (1.727 m)   Wt 199 lb (90.3 kg)   SpO2 97%   BMI 30.26 kg/m     Wt Readings from Last 3 Encounters:  01/15/19 199 lb (90.3 kg)  03/03/18 210 lb 12.8 oz (95.6 kg)  07/23/17 208 lb 6.4 oz (94.5 kg)     GEN:  Well nourished, well developed in no acute distress HEENT: Normal NECK: No JVD; No carotid bruits LYMPHATICS: No lymphadenopathy CARDIAC: RRR, no murmurs, no rubs, no gallops RESPIRATORY:  Clear to auscultation without rales, wheezing or rhonchi  ABDOMEN: Soft, non-tender, non-distended MUSCULOSKELETAL:  No edema; No deformity  SKIN: Warm and dry LOWER EXTREMITIES: no swelling NEUROLOGIC:  Alert and oriented x 3 PSYCHIATRIC:  Normal affect   ASSESSMENT:    1. Coronary artery disease involving native coronary artery of native heart without angina pectoris    PLAN:    In order of problems listed above:  1. Coronary disease stable from that point to asymptomatic 2. Essential hypertension blood pressure well controlled continue present management 3. Type 2 diabetes stable 4. Mixed dyslipidemia last fasting lipid profile from just 3 months ago show LDL of 67 with HDL 67, continue present management   Medication Adjustments/Labs and Tests Ordered: Current medicines are reviewed at length with the patient today.  Concerns regarding medicines are outlined above.  No orders of the defined types were placed in this encounter.  Medication changes: No orders of the defined types were placed in this encounter.   Signed, Park Liter, MD, [facility] 01/15/2019 3:22 PM    [place]

## 2019-02-03 DIAGNOSIS — C7951 Secondary malignant neoplasm of bone: Secondary | ICD-10-CM | POA: Diagnosis not present

## 2019-02-03 DIAGNOSIS — C61 Malignant neoplasm of prostate: Secondary | ICD-10-CM | POA: Diagnosis not present

## 2019-02-04 DIAGNOSIS — C7951 Secondary malignant neoplasm of bone: Secondary | ICD-10-CM | POA: Diagnosis not present

## 2019-02-04 DIAGNOSIS — C61 Malignant neoplasm of prostate: Secondary | ICD-10-CM | POA: Diagnosis not present

## 2019-02-04 DIAGNOSIS — Z5189 Encounter for other specified aftercare: Secondary | ICD-10-CM | POA: Diagnosis not present

## 2019-02-22 ENCOUNTER — Other Ambulatory Visit: Payer: Self-pay

## 2019-02-22 ENCOUNTER — Ambulatory Visit (INDEPENDENT_AMBULATORY_CARE_PROVIDER_SITE_OTHER): Payer: PPO

## 2019-02-22 DIAGNOSIS — I2510 Atherosclerotic heart disease of native coronary artery without angina pectoris: Secondary | ICD-10-CM | POA: Diagnosis not present

## 2019-02-22 NOTE — Progress Notes (Signed)
Complete echocardiogram has been performed.  Jimmy Starlee Corralejo RDCS, RVT 

## 2019-02-24 DIAGNOSIS — C61 Malignant neoplasm of prostate: Secondary | ICD-10-CM | POA: Diagnosis not present

## 2019-02-24 DIAGNOSIS — C7951 Secondary malignant neoplasm of bone: Secondary | ICD-10-CM | POA: Diagnosis not present

## 2019-02-25 DIAGNOSIS — Z5189 Encounter for other specified aftercare: Secondary | ICD-10-CM | POA: Diagnosis not present

## 2019-02-25 DIAGNOSIS — C7951 Secondary malignant neoplasm of bone: Secondary | ICD-10-CM | POA: Diagnosis not present

## 2019-02-25 DIAGNOSIS — C61 Malignant neoplasm of prostate: Secondary | ICD-10-CM | POA: Diagnosis not present

## 2019-03-10 ENCOUNTER — Other Ambulatory Visit: Payer: Self-pay

## 2019-03-17 DIAGNOSIS — C7951 Secondary malignant neoplasm of bone: Secondary | ICD-10-CM | POA: Diagnosis not present

## 2019-03-17 DIAGNOSIS — C61 Malignant neoplasm of prostate: Secondary | ICD-10-CM | POA: Diagnosis not present

## 2019-03-18 DIAGNOSIS — C7951 Secondary malignant neoplasm of bone: Secondary | ICD-10-CM | POA: Diagnosis not present

## 2019-03-18 DIAGNOSIS — D649 Anemia, unspecified: Secondary | ICD-10-CM | POA: Diagnosis not present

## 2019-03-18 DIAGNOSIS — C61 Malignant neoplasm of prostate: Secondary | ICD-10-CM | POA: Diagnosis not present

## 2019-03-18 DIAGNOSIS — Z5189 Encounter for other specified aftercare: Secondary | ICD-10-CM | POA: Diagnosis not present

## 2019-03-30 DIAGNOSIS — C61 Malignant neoplasm of prostate: Secondary | ICD-10-CM | POA: Diagnosis not present

## 2019-03-30 DIAGNOSIS — N3289 Other specified disorders of bladder: Secondary | ICD-10-CM | POA: Diagnosis not present

## 2019-03-30 DIAGNOSIS — R972 Elevated prostate specific antigen [PSA]: Secondary | ICD-10-CM | POA: Diagnosis not present

## 2019-04-01 ENCOUNTER — Other Ambulatory Visit (HOSPITAL_COMMUNITY): Payer: Self-pay | Admitting: Hematology and Oncology

## 2019-04-01 DIAGNOSIS — C61 Malignant neoplasm of prostate: Secondary | ICD-10-CM

## 2019-04-14 DIAGNOSIS — C61 Malignant neoplasm of prostate: Secondary | ICD-10-CM | POA: Diagnosis not present

## 2019-04-14 DIAGNOSIS — C7951 Secondary malignant neoplasm of bone: Secondary | ICD-10-CM | POA: Diagnosis not present

## 2019-04-20 ENCOUNTER — Ambulatory Visit (HOSPITAL_COMMUNITY)
Admission: RE | Admit: 2019-04-20 | Discharge: 2019-04-20 | Disposition: A | Discharge status: Home / Self Care | Payer: PPO | Source: Ambulatory Visit | Attending: Hematology and Oncology | Admitting: Hematology and Oncology

## 2019-04-20 ENCOUNTER — Other Ambulatory Visit: Payer: Self-pay

## 2019-04-20 DIAGNOSIS — C61 Malignant neoplasm of prostate: Secondary | ICD-10-CM | POA: Diagnosis not present

## 2019-04-20 DIAGNOSIS — C7951 Secondary malignant neoplasm of bone: Secondary | ICD-10-CM | POA: Diagnosis not present

## 2019-04-20 MED ORDER — AXUMIN (FLUCICLOVINE F 18) INJECTION
9.8200 | Freq: Once | INTRAVENOUS | Status: AC
  Administered 2019-04-20: 9.82 via INTRAVENOUS

## 2019-04-21 ENCOUNTER — Other Ambulatory Visit (HOSPITAL_COMMUNITY): Payer: Self-pay | Admitting: Urology

## 2019-04-21 DIAGNOSIS — C61 Malignant neoplasm of prostate: Secondary | ICD-10-CM | POA: Diagnosis not present

## 2019-04-21 DIAGNOSIS — C7951 Secondary malignant neoplasm of bone: Secondary | ICD-10-CM | POA: Diagnosis not present

## 2019-04-26 DIAGNOSIS — C7951 Secondary malignant neoplasm of bone: Secondary | ICD-10-CM | POA: Diagnosis not present

## 2019-04-26 DIAGNOSIS — R300 Dysuria: Secondary | ICD-10-CM | POA: Diagnosis not present

## 2019-04-26 DIAGNOSIS — C61 Malignant neoplasm of prostate: Secondary | ICD-10-CM | POA: Diagnosis not present

## 2019-05-12 DIAGNOSIS — C61 Malignant neoplasm of prostate: Secondary | ICD-10-CM | POA: Diagnosis not present

## 2019-05-12 DIAGNOSIS — C7951 Secondary malignant neoplasm of bone: Secondary | ICD-10-CM | POA: Diagnosis not present

## 2019-05-25 ENCOUNTER — Other Ambulatory Visit: Payer: Self-pay | Admitting: Cardiology

## 2019-05-26 DIAGNOSIS — C61 Malignant neoplasm of prostate: Secondary | ICD-10-CM | POA: Diagnosis not present

## 2019-05-26 DIAGNOSIS — C7951 Secondary malignant neoplasm of bone: Secondary | ICD-10-CM | POA: Diagnosis not present

## 2019-06-23 DIAGNOSIS — C778 Secondary and unspecified malignant neoplasm of lymph nodes of multiple regions: Secondary | ICD-10-CM | POA: Diagnosis not present

## 2019-06-23 DIAGNOSIS — C7951 Secondary malignant neoplasm of bone: Secondary | ICD-10-CM | POA: Diagnosis not present

## 2019-06-23 DIAGNOSIS — D649 Anemia, unspecified: Secondary | ICD-10-CM | POA: Diagnosis not present

## 2019-06-23 DIAGNOSIS — C61 Malignant neoplasm of prostate: Secondary | ICD-10-CM | POA: Diagnosis not present

## 2019-06-23 DIAGNOSIS — C7982 Secondary malignant neoplasm of genital organs: Secondary | ICD-10-CM | POA: Diagnosis not present

## 2019-07-07 DIAGNOSIS — C61 Malignant neoplasm of prostate: Secondary | ICD-10-CM | POA: Diagnosis not present

## 2019-07-07 DIAGNOSIS — C7951 Secondary malignant neoplasm of bone: Secondary | ICD-10-CM | POA: Diagnosis not present

## 2019-07-09 DIAGNOSIS — C61 Malignant neoplasm of prostate: Secondary | ICD-10-CM | POA: Diagnosis not present

## 2019-07-09 DIAGNOSIS — C7951 Secondary malignant neoplasm of bone: Secondary | ICD-10-CM | POA: Diagnosis not present

## 2019-07-09 DIAGNOSIS — R300 Dysuria: Secondary | ICD-10-CM | POA: Diagnosis not present

## 2019-07-15 DIAGNOSIS — C7951 Secondary malignant neoplasm of bone: Secondary | ICD-10-CM | POA: Diagnosis not present

## 2019-07-15 DIAGNOSIS — C61 Malignant neoplasm of prostate: Secondary | ICD-10-CM | POA: Diagnosis not present

## 2019-07-15 DIAGNOSIS — E538 Deficiency of other specified B group vitamins: Secondary | ICD-10-CM | POA: Diagnosis not present

## 2019-07-15 DIAGNOSIS — R300 Dysuria: Secondary | ICD-10-CM | POA: Diagnosis not present

## 2019-07-19 DIAGNOSIS — D649 Anemia, unspecified: Secondary | ICD-10-CM | POA: Diagnosis not present

## 2019-07-19 DIAGNOSIS — C7951 Secondary malignant neoplasm of bone: Secondary | ICD-10-CM | POA: Diagnosis not present

## 2019-07-19 DIAGNOSIS — C61 Malignant neoplasm of prostate: Secondary | ICD-10-CM | POA: Diagnosis not present

## 2019-07-20 DIAGNOSIS — C61 Malignant neoplasm of prostate: Secondary | ICD-10-CM | POA: Diagnosis not present

## 2019-07-20 DIAGNOSIS — N3289 Other specified disorders of bladder: Secondary | ICD-10-CM | POA: Diagnosis not present

## 2019-07-20 DIAGNOSIS — M549 Dorsalgia, unspecified: Secondary | ICD-10-CM | POA: Diagnosis not present

## 2019-08-04 DIAGNOSIS — D649 Anemia, unspecified: Secondary | ICD-10-CM | POA: Diagnosis not present

## 2019-08-04 DIAGNOSIS — E860 Dehydration: Secondary | ICD-10-CM | POA: Diagnosis not present

## 2019-08-04 DIAGNOSIS — C61 Malignant neoplasm of prostate: Secondary | ICD-10-CM | POA: Diagnosis not present

## 2019-08-04 DIAGNOSIS — C7951 Secondary malignant neoplasm of bone: Secondary | ICD-10-CM | POA: Diagnosis not present

## 2019-08-06 ENCOUNTER — Other Ambulatory Visit (HOSPITAL_COMMUNITY): Payer: Self-pay | Admitting: Oncology

## 2019-08-06 DIAGNOSIS — C61 Malignant neoplasm of prostate: Secondary | ICD-10-CM

## 2019-08-19 DIAGNOSIS — Z6828 Body mass index (BMI) 28.0-28.9, adult: Secondary | ICD-10-CM | POA: Diagnosis not present

## 2019-08-19 DIAGNOSIS — E7800 Pure hypercholesterolemia, unspecified: Secondary | ICD-10-CM | POA: Diagnosis not present

## 2019-08-19 DIAGNOSIS — E1165 Type 2 diabetes mellitus with hyperglycemia: Secondary | ICD-10-CM | POA: Diagnosis not present

## 2019-08-19 DIAGNOSIS — I10 Essential (primary) hypertension: Secondary | ICD-10-CM | POA: Diagnosis not present

## 2019-08-19 DIAGNOSIS — Z8546 Personal history of malignant neoplasm of prostate: Secondary | ICD-10-CM | POA: Diagnosis not present

## 2019-08-19 DIAGNOSIS — Z1331 Encounter for screening for depression: Secondary | ICD-10-CM | POA: Diagnosis not present

## 2019-08-24 ENCOUNTER — Ambulatory Visit (HOSPITAL_COMMUNITY)
Admission: RE | Admit: 2019-08-24 | Discharge: 2019-08-24 | Disposition: A | Discharge status: Home / Self Care | Payer: PPO | Source: Ambulatory Visit | Attending: Oncology | Admitting: Oncology

## 2019-08-24 ENCOUNTER — Other Ambulatory Visit: Payer: Self-pay

## 2019-08-24 DIAGNOSIS — C61 Malignant neoplasm of prostate: Secondary | ICD-10-CM | POA: Diagnosis not present

## 2019-08-24 MED ORDER — AXUMIN (FLUCICLOVINE F 18) INJECTION
10.1600 | Freq: Once | INTRAVENOUS | Status: AC
  Administered 2019-08-24: 14:00:00 10.16 via INTRAVENOUS

## 2019-08-26 DIAGNOSIS — C61 Malignant neoplasm of prostate: Secondary | ICD-10-CM | POA: Diagnosis not present

## 2019-08-26 DIAGNOSIS — C771 Secondary and unspecified malignant neoplasm of intrathoracic lymph nodes: Secondary | ICD-10-CM | POA: Diagnosis not present

## 2019-08-26 DIAGNOSIS — D649 Anemia, unspecified: Secondary | ICD-10-CM | POA: Diagnosis not present

## 2019-08-26 DIAGNOSIS — C7951 Secondary malignant neoplasm of bone: Secondary | ICD-10-CM | POA: Diagnosis not present

## 2019-08-31 DIAGNOSIS — C61 Malignant neoplasm of prostate: Secondary | ICD-10-CM | POA: Diagnosis not present

## 2019-08-31 DIAGNOSIS — Z0001 Encounter for general adult medical examination with abnormal findings: Secondary | ICD-10-CM | POA: Diagnosis not present

## 2019-08-31 DIAGNOSIS — D649 Anemia, unspecified: Secondary | ICD-10-CM | POA: Diagnosis not present

## 2019-09-01 DIAGNOSIS — C61 Malignant neoplasm of prostate: Secondary | ICD-10-CM | POA: Diagnosis not present

## 2019-09-01 DIAGNOSIS — Z5189 Encounter for other specified aftercare: Secondary | ICD-10-CM | POA: Diagnosis not present

## 2019-09-01 DIAGNOSIS — C7951 Secondary malignant neoplasm of bone: Secondary | ICD-10-CM | POA: Diagnosis not present

## 2019-09-09 DIAGNOSIS — C7951 Secondary malignant neoplasm of bone: Secondary | ICD-10-CM | POA: Diagnosis not present

## 2019-09-09 DIAGNOSIS — C61 Malignant neoplasm of prostate: Secondary | ICD-10-CM | POA: Diagnosis not present

## 2019-09-21 DIAGNOSIS — C61 Malignant neoplasm of prostate: Secondary | ICD-10-CM | POA: Diagnosis not present

## 2019-09-21 DIAGNOSIS — D649 Anemia, unspecified: Secondary | ICD-10-CM | POA: Diagnosis not present

## 2019-09-21 DIAGNOSIS — C7951 Secondary malignant neoplasm of bone: Secondary | ICD-10-CM | POA: Diagnosis not present

## 2019-09-21 DIAGNOSIS — C600 Malignant neoplasm of prepuce: Secondary | ICD-10-CM | POA: Diagnosis not present

## 2019-09-21 DIAGNOSIS — M79604 Pain in right leg: Secondary | ICD-10-CM | POA: Diagnosis not present

## 2019-09-21 DIAGNOSIS — M79661 Pain in right lower leg: Secondary | ICD-10-CM | POA: Diagnosis not present

## 2019-09-22 DIAGNOSIS — C61 Malignant neoplasm of prostate: Secondary | ICD-10-CM | POA: Diagnosis not present

## 2019-09-22 DIAGNOSIS — Z5189 Encounter for other specified aftercare: Secondary | ICD-10-CM | POA: Diagnosis not present

## 2019-09-22 DIAGNOSIS — C7951 Secondary malignant neoplasm of bone: Secondary | ICD-10-CM | POA: Diagnosis not present

## 2019-09-29 DIAGNOSIS — C7951 Secondary malignant neoplasm of bone: Secondary | ICD-10-CM | POA: Diagnosis not present

## 2019-09-29 DIAGNOSIS — C778 Secondary and unspecified malignant neoplasm of lymph nodes of multiple regions: Secondary | ICD-10-CM | POA: Diagnosis not present

## 2019-09-29 DIAGNOSIS — C61 Malignant neoplasm of prostate: Secondary | ICD-10-CM | POA: Diagnosis not present

## 2019-09-29 DIAGNOSIS — C7982 Secondary malignant neoplasm of genital organs: Secondary | ICD-10-CM | POA: Diagnosis not present

## 2019-10-12 DIAGNOSIS — C61 Malignant neoplasm of prostate: Secondary | ICD-10-CM | POA: Diagnosis not present

## 2019-10-12 DIAGNOSIS — C7951 Secondary malignant neoplasm of bone: Secondary | ICD-10-CM | POA: Diagnosis not present

## 2019-10-14 DIAGNOSIS — C7951 Secondary malignant neoplasm of bone: Secondary | ICD-10-CM | POA: Diagnosis not present

## 2019-10-14 DIAGNOSIS — C61 Malignant neoplasm of prostate: Secondary | ICD-10-CM | POA: Diagnosis not present

## 2019-10-14 DIAGNOSIS — Z5189 Encounter for other specified aftercare: Secondary | ICD-10-CM | POA: Diagnosis not present

## 2019-10-20 ENCOUNTER — Telehealth: Payer: Self-pay | Admitting: Cardiology

## 2019-10-20 NOTE — Telephone Encounter (Signed)
New message   Pt c/o BP issue: STAT if pt c/o blurred vision, one-sided weakness or slurred speech  1. What are your last 5 BP readings? 110/58   2. Are you having any other symptoms (ex. Dizziness, headache, blurred vision, passed out)?no   3. What is your BP issue? Patient states that his b/p is low

## 2019-10-20 NOTE — Telephone Encounter (Signed)
FYI: Pt has been being treated for prostate cancer and reports that his BP has been lower. Pt states that Dr. Hinton Rao has taken him off of his Metoprolol. Pt states he has lost 40 pounds since January. Pt states that Dr. Lin Landsman cut his Crestor in half. Pt reports that he has burning in his esophagus which is relived taking tums. Pt also reports when he bends over or squats that he is lightheaded with standing. Pt requested an appointment to be seen for Dr. Agustin Cree to evaluate his medications. Appointment for 12/02/19. Pt placed on a wait list for an earlier appointment.

## 2019-10-21 DIAGNOSIS — N3289 Other specified disorders of bladder: Secondary | ICD-10-CM | POA: Diagnosis not present

## 2019-10-21 DIAGNOSIS — C61 Malignant neoplasm of prostate: Secondary | ICD-10-CM | POA: Diagnosis not present

## 2019-10-21 DIAGNOSIS — M549 Dorsalgia, unspecified: Secondary | ICD-10-CM | POA: Diagnosis not present

## 2019-11-03 DIAGNOSIS — C61 Malignant neoplasm of prostate: Secondary | ICD-10-CM | POA: Diagnosis not present

## 2019-11-03 DIAGNOSIS — D649 Anemia, unspecified: Secondary | ICD-10-CM | POA: Diagnosis not present

## 2019-11-05 DIAGNOSIS — C7951 Secondary malignant neoplasm of bone: Secondary | ICD-10-CM | POA: Diagnosis not present

## 2019-11-05 DIAGNOSIS — C61 Malignant neoplasm of prostate: Secondary | ICD-10-CM | POA: Diagnosis not present

## 2019-11-05 DIAGNOSIS — Z5189 Encounter for other specified aftercare: Secondary | ICD-10-CM | POA: Diagnosis not present

## 2019-11-22 DIAGNOSIS — J90 Pleural effusion, not elsewhere classified: Secondary | ICD-10-CM | POA: Diagnosis not present

## 2019-11-22 DIAGNOSIS — R590 Localized enlarged lymph nodes: Secondary | ICD-10-CM | POA: Diagnosis not present

## 2019-11-22 DIAGNOSIS — J929 Pleural plaque without asbestos: Secondary | ICD-10-CM | POA: Diagnosis not present

## 2019-11-22 DIAGNOSIS — C61 Malignant neoplasm of prostate: Secondary | ICD-10-CM | POA: Diagnosis not present

## 2019-11-22 DIAGNOSIS — C7951 Secondary malignant neoplasm of bone: Secondary | ICD-10-CM | POA: Diagnosis not present

## 2019-11-22 DIAGNOSIS — I2510 Atherosclerotic heart disease of native coronary artery without angina pectoris: Secondary | ICD-10-CM | POA: Diagnosis not present

## 2019-11-22 DIAGNOSIS — I700 Atherosclerosis of aorta: Secondary | ICD-10-CM | POA: Diagnosis not present

## 2019-11-24 ENCOUNTER — Telehealth: Payer: Self-pay | Admitting: Cardiology

## 2019-11-24 DIAGNOSIS — C61 Malignant neoplasm of prostate: Secondary | ICD-10-CM | POA: Diagnosis not present

## 2019-11-24 DIAGNOSIS — C7951 Secondary malignant neoplasm of bone: Secondary | ICD-10-CM | POA: Diagnosis not present

## 2019-11-24 DIAGNOSIS — M545 Low back pain: Secondary | ICD-10-CM | POA: Diagnosis not present

## 2019-11-24 DIAGNOSIS — C771 Secondary and unspecified malignant neoplasm of intrathoracic lymph nodes: Secondary | ICD-10-CM | POA: Diagnosis not present

## 2019-11-24 DIAGNOSIS — Z09 Encounter for follow-up examination after completed treatment for conditions other than malignant neoplasm: Secondary | ICD-10-CM

## 2019-11-24 NOTE — Telephone Encounter (Signed)
New Message:    Everlene Balls says pt will need an Echo before he start his new round of Chemo please.

## 2019-11-24 NOTE — Telephone Encounter (Signed)
Yes, let us do echo

## 2019-11-25 NOTE — Telephone Encounter (Signed)
Order placed. Will get with scheduler to get scheduled.

## 2019-11-25 NOTE — Telephone Encounter (Signed)
Dx is pre chemo

## 2019-11-26 NOTE — Telephone Encounter (Signed)
Left message for patient to return call to give him the appointment date and time for the echo.

## 2019-11-26 NOTE — Telephone Encounter (Signed)
Follow up   Pt called back, he said he already scheduled in [place] hospital for echo on 11/30/2019 at 11 am. He would like to know if he still need to get the echo on 09/01

## 2019-11-29 DIAGNOSIS — Z8546 Personal history of malignant neoplasm of prostate: Secondary | ICD-10-CM | POA: Diagnosis not present

## 2019-11-29 DIAGNOSIS — R9389 Abnormal findings on diagnostic imaging of other specified body structures: Secondary | ICD-10-CM | POA: Diagnosis not present

## 2019-11-29 DIAGNOSIS — M47814 Spondylosis without myelopathy or radiculopathy, thoracic region: Secondary | ICD-10-CM | POA: Diagnosis not present

## 2019-11-29 DIAGNOSIS — M48061 Spinal stenosis, lumbar region without neurogenic claudication: Secondary | ICD-10-CM | POA: Diagnosis not present

## 2019-11-29 DIAGNOSIS — J90 Pleural effusion, not elsewhere classified: Secondary | ICD-10-CM | POA: Diagnosis not present

## 2019-11-29 DIAGNOSIS — M5136 Other intervertebral disc degeneration, lumbar region: Secondary | ICD-10-CM | POA: Diagnosis not present

## 2019-11-29 DIAGNOSIS — M4807 Spinal stenosis, lumbosacral region: Secondary | ICD-10-CM | POA: Diagnosis not present

## 2019-11-29 DIAGNOSIS — C7951 Secondary malignant neoplasm of bone: Secondary | ICD-10-CM | POA: Diagnosis not present

## 2019-11-29 DIAGNOSIS — C61 Malignant neoplasm of prostate: Secondary | ICD-10-CM | POA: Diagnosis not present

## 2019-11-30 DIAGNOSIS — I361 Nonrheumatic tricuspid (valve) insufficiency: Secondary | ICD-10-CM | POA: Diagnosis not present

## 2019-11-30 DIAGNOSIS — C61 Malignant neoplasm of prostate: Secondary | ICD-10-CM | POA: Diagnosis not present

## 2019-11-30 DIAGNOSIS — C7951 Secondary malignant neoplasm of bone: Secondary | ICD-10-CM | POA: Diagnosis not present

## 2019-12-01 DIAGNOSIS — C61 Malignant neoplasm of prostate: Secondary | ICD-10-CM | POA: Diagnosis not present

## 2019-12-01 DIAGNOSIS — Z510 Encounter for antineoplastic radiation therapy: Secondary | ICD-10-CM | POA: Diagnosis not present

## 2019-12-01 DIAGNOSIS — C7951 Secondary malignant neoplasm of bone: Secondary | ICD-10-CM | POA: Diagnosis not present

## 2019-12-02 ENCOUNTER — Ambulatory Visit: Payer: PPO | Admitting: Cardiology

## 2019-12-02 ENCOUNTER — Encounter: Payer: Self-pay | Admitting: Cardiology

## 2019-12-02 ENCOUNTER — Other Ambulatory Visit: Payer: Self-pay

## 2019-12-02 VITALS — BP 118/60 | HR 67 | Ht 68.0 in | Wt 167.0 lb

## 2019-12-02 DIAGNOSIS — C7951 Secondary malignant neoplasm of bone: Secondary | ICD-10-CM

## 2019-12-02 DIAGNOSIS — I2510 Atherosclerotic heart disease of native coronary artery without angina pectoris: Secondary | ICD-10-CM | POA: Diagnosis not present

## 2019-12-02 DIAGNOSIS — C61 Malignant neoplasm of prostate: Secondary | ICD-10-CM | POA: Diagnosis not present

## 2019-12-02 DIAGNOSIS — I10 Essential (primary) hypertension: Secondary | ICD-10-CM

## 2019-12-02 DIAGNOSIS — E782 Mixed hyperlipidemia: Secondary | ICD-10-CM | POA: Diagnosis not present

## 2019-12-02 DIAGNOSIS — Z510 Encounter for antineoplastic radiation therapy: Secondary | ICD-10-CM | POA: Diagnosis not present

## 2019-12-02 NOTE — Progress Notes (Signed)
Cardiology Office Note:    Date:  12/02/2019   ID:  Perry Wolfe, DOB October 20, 1950, MRN [id number]  PCP:  Angelina Sheriff, MD  Cardiologist:  Jenne Campus, MD    Referring MD: Angelina Sheriff, MD   No chief complaint on file. Am doing poor  History of Present Illness:    Perry Wolfe is a 69 y.o. male with past medical history significant for remote coronary artery disease status post intervention long time ago, essential hypertension, dyslipidemia, diabetes.  Prostate cancer now with metastasis.  He comes today 2 months to follow-up.  Overall he is doing poorly with chemotherapy he is weak tired exhausted does not have energy.  He lost significant amount of weight he looked pale and look sick.  However denies have any cardiac complaints.  No chest pain tightness squeezing pressure burning chest.  Past Medical History:  Diagnosis Date  . Cancer ([place])   . Diabetes mellitus without complication ([place])   . Hx of radiation therapy   . Hyperlipidemia   . Hypertension     Past Surgical History:  Procedure Laterality Date  . CARDIAC CATHETERIZATION    . CORONARY ANGIOPLASTY    . ORTHOPEDIC SURGERY    . Prostate Injection      Current Medications: Current Meds  Medication Sig  . aspirin EC 81 MG tablet Take 81 mg by mouth every other day.   . cyanocobalamin 2000 MCG tablet Take 2,000 mcg by mouth daily.  . metformin (FORTAMET) 500 MG (OSM) 24 hr tablet Take 500 mg by mouth in the morning and at bedtime.   . nitroGLYCERIN (NITROSTAT) 0.4 MG SL tablet Place 1 tablet (0.4 mg total) under the tongue as needed for chest pain.  Marland Kitchen omeprazole (PRILOSEC) 20 MG capsule Take 20 mg by mouth daily.  . predniSONE (DELTASONE) 5 MG tablet Take 1 tablet by mouth. Twice a day for 3 days after chemo  . prochlorperazine (COMPAZINE) 10 MG tablet Take 10 mg by mouth as needed.   . rosuvastatin (CRESTOR) 20 MG tablet TAKE 1 TABLETY ONCE DAILY (Patient taking differently: Take 10 mg  by mouth daily. )  . tamsulosin (FLOMAX) 0.4 MG CAPS capsule Take 0.8 mg by mouth daily.   Marland Kitchen UNABLE TO FIND Med Name: B17  . [DISCONTINUED] metoprolol succinate (TOPROL-XL) 25 MG 24 hr tablet Take 25 mg by mouth daily.     Allergies:   Patient has no known allergies.   Social History   Socioeconomic History  . Marital status: Married    Spouse name: Not on file  . Number of children: Not on file  . Years of education: Not on file  . Highest education level: Not on file  Occupational History  . Not on file  Tobacco Use  . Smoking status: Former Research scientist (life sciences)  . Smokeless tobacco: Never Used  Vaping Use  . Vaping Use: Never used  Substance and Sexual Activity  . Alcohol use: Yes  . Drug use: No  . Sexual activity: Not on file  Other Topics Concern  . Not on file  Social History Narrative  . Not on file   Social Determinants of Health   Financial Resource Strain:   . Difficulty of Paying Living Expenses: Not on file  Food Insecurity:   . Worried About Charity fundraiser in the Last Year: Not on file  . Ran Out of Food in the Last Year: Not on file  Transportation Needs:   .  Lack of Transportation (Medical): Not on file  . Lack of Transportation (Non-Medical): Not on file  Physical Activity:   . Days of Exercise per Week: Not on file  . Minutes of Exercise per Session: Not on file  Stress:   . Feeling of Stress : Not on file  Social Connections:   . Frequency of Communication with Friends and Family: Not on file  . Frequency of Social Gatherings with Friends and Family: Not on file  . Attends Religious Services: Not on file  . Active Member of Clubs or Organizations: Not on file  . Attends Archivist Meetings: Not on file  . Marital Status: Not on file     Family History: The patient's family history includes Lung cancer in his father; Stroke in his father. ROS:   Please see the history of present illness.    All 14 point review of systems negative except  as described per history of present illness  EKGs/Labs/Other Studies Reviewed:      Recent Labs: No results found for requested labs within last 8760 hours.  Recent Lipid Panel    Component Value Date/Time   CHOL 137 07/23/2017 0825   TRIG 115 07/23/2017 0825   HDL 36 (L) 07/23/2017 0825   CHOLHDL 3.8 07/23/2017 0825   LDLCALC 78 07/23/2017 0825    Physical Exam:    VS:  BP 118/60   Pulse 67   Ht 5\' 8"  (1.727 m)   Wt 167 lb (75.8 kg)   SpO2 93%   BMI 25.39 kg/m     Wt Readings from Last 3 Encounters:  12/02/19 167 lb (75.8 kg)  01/15/19 199 lb (90.3 kg)  03/03/18 210 lb 12.8 oz (95.6 kg)     GEN:  Well nourished, well developed in no acute distress HEENT: Normal NECK: No JVD; No carotid bruits LYMPHATICS: No lymphadenopathy CARDIAC: RRR, no murmurs, no rubs, no gallops RESPIRATORY:  Clear to auscultation without rales, wheezing or rhonchi  ABDOMEN: Soft, non-tender, non-distended MUSCULOSKELETAL:  No edema; No deformity  SKIN: Warm and dry LOWER EXTREMITIES: no swelling NEUROLOGIC:  Alert and oriented x 3 PSYCHIATRIC:  Normal affect   ASSESSMENT:    1. Coronary artery disease involving native coronary artery of native heart without angina pectoris   2. Essential hypertension   3. Mixed hyperlipidemia   4. Prostate cancer metastatic to bone [facility])    PLAN:    In order of problems listed above:  1. Coronary disease stable from that point review.  He did not have any recent coronary event, there was no coronary event within last 6 months.  Biggest difficulty that he is facing especially around chemotherapy is hypotension.  Asked him to discontinue Toprol-XL he takes only 25 mg. 2. Prostate cancer with metastasis treated by oncology team.  He is had intolerance to multiple medications.  He just had echocardiogram done to check before chemotherapy.  I am not sure exactly which chemotherapeutic agent will be used.  Likely his left ventricle ejection fraction was  normal his global strength was -20.  I will maintain him on ACE inhibitor since ACE inhibitor does have some cardioprotective effect for people on chemotherapy.  There is not much data about beta-blocker and I discontinue beta-blocker because of his hypotension.  He will have repeated echocardiogram within next 3 months.  I will also see him back in my office within next 3 months. 3. Dyslipidemia is he still on Crestor however value of this medication list  clinical scenario is somewhat questionable.  I will continue since he seems to be tolerating it well. 4. Essential hypertension: We have the opposite problem right now blood pressure being low.  I will discontinue metoprolol he was only on 25 mg.   Medication Adjustments/Labs and Tests Ordered: Current medicines are reviewed at length with the patient today.  Concerns regarding medicines are outlined above.  Orders Placed This Encounter  Procedures  . EKG 12-Lead   Medication changes: No orders of the defined types were placed in this encounter.   Signed, Park Liter, MD, [facility] 12/02/2019 10:52 AM    [place]

## 2019-12-02 NOTE — Patient Instructions (Signed)
Medication Instructions:  Your physician has recommended you make the following change in your medication:   STOP: Metoprolol  *If you need a refill on your cardiac medications before your next appointment, please call your pharmacy*   Lab Work: None If you have labs (blood work) drawn today and your tests are completely normal, you will receive your results only by: . MyChart Message (if you have MyChart) OR . A paper copy in the mail If you have any lab test that is abnormal or we need to change your treatment, we will call you to review the results.   Testing/Procedures: None   Follow-Up: At [facility], you and your health needs are our priority.  As part of our continuing mission to provide you with exceptional heart care, we have created designated Provider Care Teams.  These Care Teams include your primary Cardiologist (physician) and Advanced Practice Providers (APPs -  Physician Assistants and Nurse Practitioners) who all work together to provide you with the care you need, when you need it.  We recommend signing up for the patient portal called "MyChart".  Sign up information is provided on this After Visit Summary.  MyChart is used to connect with patients for Virtual Visits (Telemedicine).  Patients are able to view lab/test results, encounter notes, upcoming appointments, etc.  Non-urgent messages can be sent to your provider as well.   To learn more about what you can do with MyChart, go to [email or web address].    Your next appointment:   3 month(s)  The format for your next appointment:   In Person  Provider:   Robert Krasowski, MD   Other Instructions    

## 2019-12-06 DIAGNOSIS — C61 Malignant neoplasm of prostate: Secondary | ICD-10-CM | POA: Diagnosis not present

## 2019-12-06 DIAGNOSIS — D649 Anemia, unspecified: Secondary | ICD-10-CM | POA: Diagnosis not present

## 2019-12-06 DIAGNOSIS — C7951 Secondary malignant neoplasm of bone: Secondary | ICD-10-CM | POA: Diagnosis not present

## 2019-12-07 DIAGNOSIS — C61 Malignant neoplasm of prostate: Secondary | ICD-10-CM | POA: Diagnosis not present

## 2019-12-07 DIAGNOSIS — Z510 Encounter for antineoplastic radiation therapy: Secondary | ICD-10-CM | POA: Diagnosis not present

## 2019-12-07 DIAGNOSIS — C7951 Secondary malignant neoplasm of bone: Secondary | ICD-10-CM | POA: Diagnosis not present

## 2019-12-08 DIAGNOSIS — Z510 Encounter for antineoplastic radiation therapy: Secondary | ICD-10-CM | POA: Diagnosis not present

## 2019-12-08 DIAGNOSIS — C61 Malignant neoplasm of prostate: Secondary | ICD-10-CM | POA: Diagnosis not present

## 2019-12-08 DIAGNOSIS — C7951 Secondary malignant neoplasm of bone: Secondary | ICD-10-CM | POA: Diagnosis not present

## 2019-12-08 NOTE — Telephone Encounter (Signed)
Looks like echo has already been cancelled.

## 2019-12-09 DIAGNOSIS — C7951 Secondary malignant neoplasm of bone: Secondary | ICD-10-CM | POA: Diagnosis not present

## 2019-12-09 DIAGNOSIS — Z510 Encounter for antineoplastic radiation therapy: Secondary | ICD-10-CM | POA: Diagnosis not present

## 2019-12-09 DIAGNOSIS — C61 Malignant neoplasm of prostate: Secondary | ICD-10-CM | POA: Diagnosis not present

## 2019-12-10 DIAGNOSIS — C7951 Secondary malignant neoplasm of bone: Secondary | ICD-10-CM | POA: Diagnosis not present

## 2019-12-10 DIAGNOSIS — Z510 Encounter for antineoplastic radiation therapy: Secondary | ICD-10-CM | POA: Diagnosis not present

## 2019-12-13 DIAGNOSIS — C7951 Secondary malignant neoplasm of bone: Secondary | ICD-10-CM | POA: Diagnosis not present

## 2019-12-13 DIAGNOSIS — R911 Solitary pulmonary nodule: Secondary | ICD-10-CM | POA: Diagnosis not present

## 2019-12-13 DIAGNOSIS — E860 Dehydration: Secondary | ICD-10-CM | POA: Diagnosis not present

## 2019-12-13 DIAGNOSIS — C771 Secondary and unspecified malignant neoplasm of intrathoracic lymph nodes: Secondary | ICD-10-CM | POA: Diagnosis not present

## 2019-12-13 DIAGNOSIS — R0602 Shortness of breath: Secondary | ICD-10-CM | POA: Diagnosis not present

## 2019-12-13 DIAGNOSIS — C61 Malignant neoplasm of prostate: Secondary | ICD-10-CM | POA: Diagnosis not present

## 2019-12-13 DIAGNOSIS — R918 Other nonspecific abnormal finding of lung field: Secondary | ICD-10-CM | POA: Diagnosis not present

## 2019-12-14 DIAGNOSIS — Z510 Encounter for antineoplastic radiation therapy: Secondary | ICD-10-CM | POA: Diagnosis not present

## 2019-12-14 DIAGNOSIS — C7951 Secondary malignant neoplasm of bone: Secondary | ICD-10-CM | POA: Diagnosis not present

## 2019-12-14 DIAGNOSIS — C61 Malignant neoplasm of prostate: Secondary | ICD-10-CM | POA: Diagnosis not present

## 2019-12-15 ENCOUNTER — Other Ambulatory Visit: Payer: PPO

## 2019-12-15 DIAGNOSIS — C61 Malignant neoplasm of prostate: Secondary | ICD-10-CM | POA: Diagnosis not present

## 2019-12-15 DIAGNOSIS — Z510 Encounter for antineoplastic radiation therapy: Secondary | ICD-10-CM | POA: Diagnosis not present

## 2019-12-15 DIAGNOSIS — C7951 Secondary malignant neoplasm of bone: Secondary | ICD-10-CM | POA: Diagnosis not present

## 2019-12-16 DIAGNOSIS — D649 Anemia, unspecified: Secondary | ICD-10-CM | POA: Diagnosis not present

## 2019-12-16 DIAGNOSIS — C7951 Secondary malignant neoplasm of bone: Secondary | ICD-10-CM | POA: Diagnosis not present

## 2019-12-16 DIAGNOSIS — C7800 Secondary malignant neoplasm of unspecified lung: Secondary | ICD-10-CM | POA: Diagnosis not present

## 2019-12-16 DIAGNOSIS — C720 Malignant neoplasm of spinal cord: Secondary | ICD-10-CM | POA: Diagnosis not present

## 2019-12-16 DIAGNOSIS — M545 Low back pain: Secondary | ICD-10-CM | POA: Diagnosis not present

## 2019-12-16 DIAGNOSIS — C61 Malignant neoplasm of prostate: Secondary | ICD-10-CM | POA: Diagnosis not present

## 2019-12-16 DIAGNOSIS — G9529 Other cord compression: Secondary | ICD-10-CM | POA: Diagnosis not present

## 2019-12-17 DIAGNOSIS — C7951 Secondary malignant neoplasm of bone: Secondary | ICD-10-CM | POA: Diagnosis not present

## 2019-12-21 DIAGNOSIS — M79605 Pain in left leg: Secondary | ICD-10-CM | POA: Diagnosis not present

## 2019-12-21 DIAGNOSIS — R0602 Shortness of breath: Secondary | ICD-10-CM | POA: Diagnosis not present

## 2019-12-21 DIAGNOSIS — D649 Anemia, unspecified: Secondary | ICD-10-CM | POA: Diagnosis not present

## 2019-12-21 DIAGNOSIS — C7951 Secondary malignant neoplasm of bone: Secondary | ICD-10-CM | POA: Diagnosis not present

## 2019-12-21 DIAGNOSIS — C61 Malignant neoplasm of prostate: Secondary | ICD-10-CM | POA: Diagnosis not present

## 2019-12-21 DIAGNOSIS — M7989 Other specified soft tissue disorders: Secondary | ICD-10-CM | POA: Diagnosis not present

## 2019-12-21 DIAGNOSIS — M79604 Pain in right leg: Secondary | ICD-10-CM | POA: Diagnosis not present

## 2019-12-21 DIAGNOSIS — C7982 Secondary malignant neoplasm of genital organs: Secondary | ICD-10-CM | POA: Diagnosis not present

## 2019-12-22 DIAGNOSIS — D649 Anemia, unspecified: Secondary | ICD-10-CM | POA: Diagnosis not present

## 2019-12-22 DIAGNOSIS — C61 Malignant neoplasm of prostate: Secondary | ICD-10-CM | POA: Diagnosis not present

## 2019-12-22 DIAGNOSIS — J90 Pleural effusion, not elsewhere classified: Secondary | ICD-10-CM | POA: Diagnosis not present

## 2019-12-22 DIAGNOSIS — J948 Other specified pleural conditions: Secondary | ICD-10-CM | POA: Diagnosis not present

## 2019-12-22 DIAGNOSIS — Z8546 Personal history of malignant neoplasm of prostate: Secondary | ICD-10-CM | POA: Diagnosis not present

## 2019-12-22 DIAGNOSIS — Z1159 Encounter for screening for other viral diseases: Secondary | ICD-10-CM | POA: Diagnosis not present

## 2019-12-22 DIAGNOSIS — C7951 Secondary malignant neoplasm of bone: Secondary | ICD-10-CM | POA: Diagnosis not present

## 2020-01-02 ENCOUNTER — Encounter: Payer: Self-pay | Admitting: Oncology

## 2020-01-14 DEATH — deceased

## 2020-03-03 ENCOUNTER — Ambulatory Visit: Payer: PPO | Admitting: Cardiology

## 2020-10-23 IMAGING — CT NM PET NOPR SKULL BASE TO THIGH
7 series · 25 of 25 positions shown · non-contrast
Comparison: Axial min PET-CT 09/17/2016, bone scan 09/05/2017

CLINICAL DATA: Prostate carcinoma with biochemical recurrence. PSA
equal 20

EXAM:
NUCLEAR MEDICINE PET SKULL BASE TO THIGH
TECHNIQUE: 7.57 mCi F-18 Fluciclovine was injected intravenously. Full-ring PET
imaging was performed from the skull base to thigh after the
radiotracer. CT data was obtained and used for attenuation
correction and anatomic localization.

[Series 3: pet sk_thigh ac · axial · 5.0mm · 4.07mm/px · z∈[+990,+1902]mm · 5 of 229 slices shown]
[im 1/229]
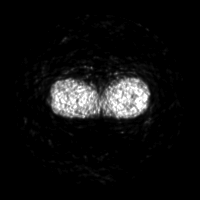
[im 58/229]
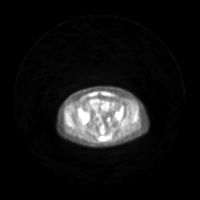
[im 115/229]
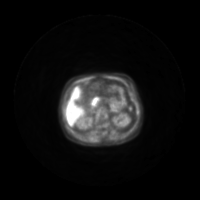
[im 172/229]
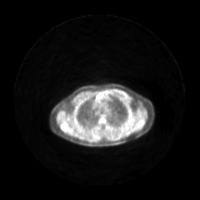
[im 229/229]
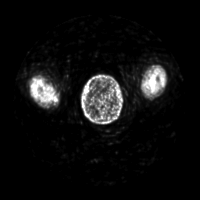

[Series 4: ct sk_thigh 5.0 hd_fov · axial · 5.0mm · 1.52mm/px · z∈[+990,+1902]mm · 6 of 229 slices shown]
[im 1/229  brain]
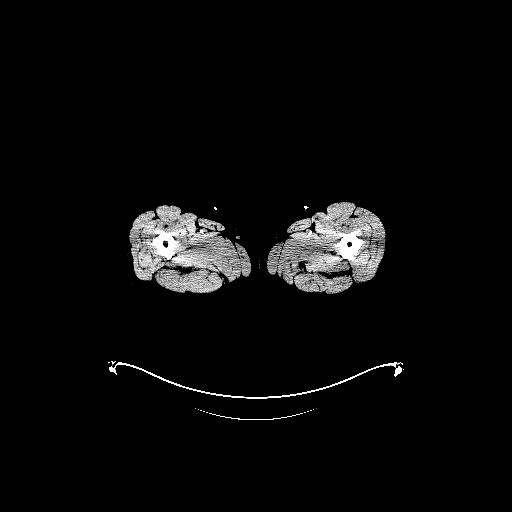
[im 46/229]
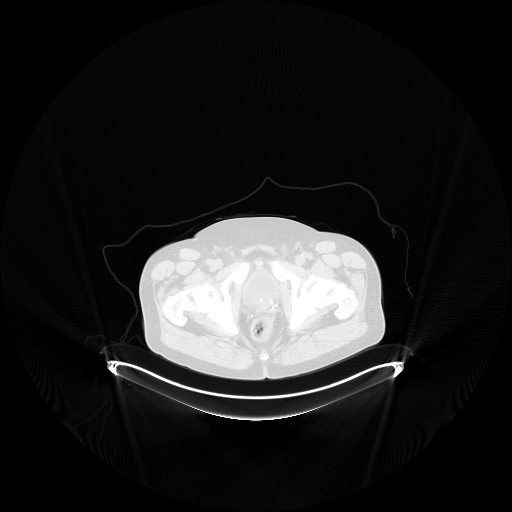
[im 92/229]
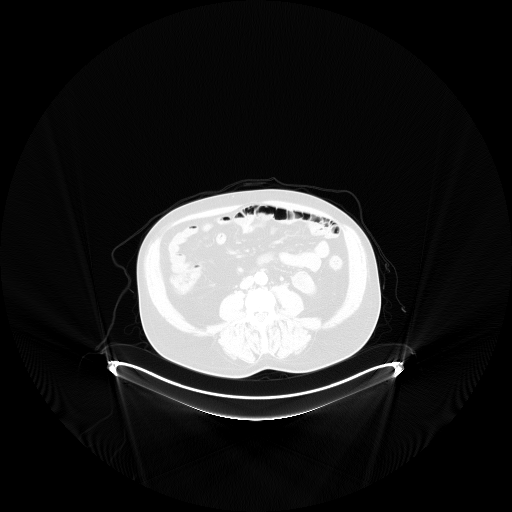
[im 137/229  brain]
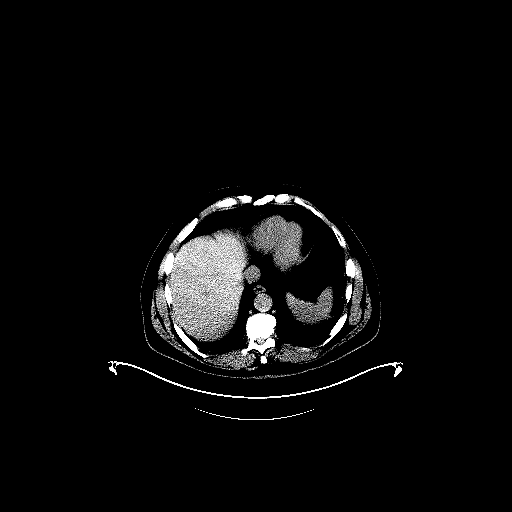
[im 183/229]
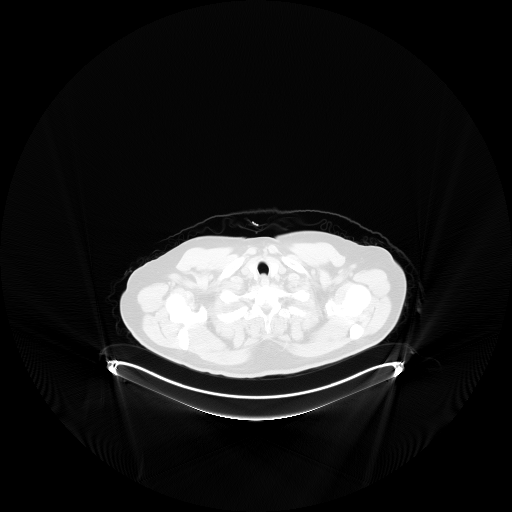
[im 229/229]
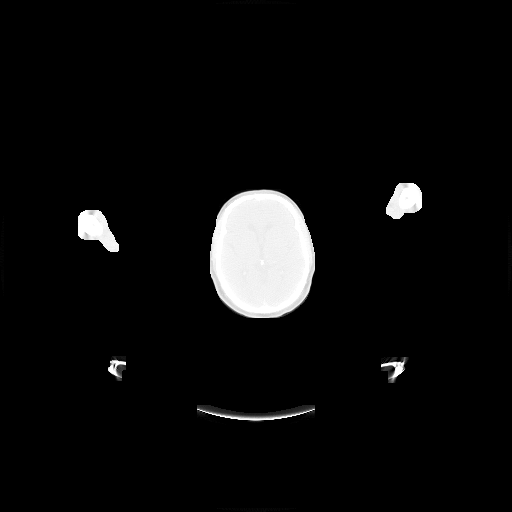

[Series 5: pet sk_thigh nac · axial · 5.0mm · 4.07mm/px · z∈[+990,+1902]mm · 6 of 229 slices shown]
[im 1/229]
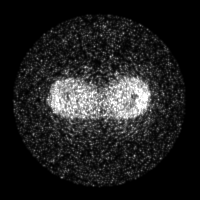
[im 46/229]
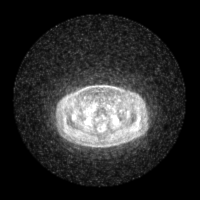
[im 92/229]
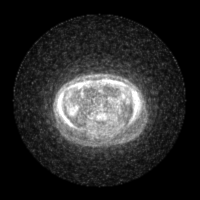
[im 137/229]
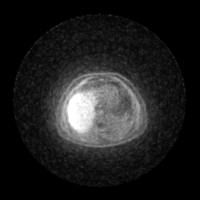
[im 183/229]
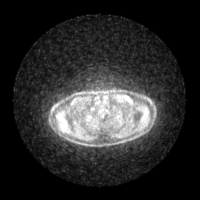
[im 229/229]
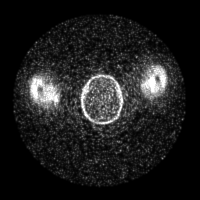

[Series 8: ct sk_thigh 5.0 (id) lung_bone · axial · 5.0mm · 0.73mm/px · 1 of 54 slices shown]
[im 1/54  bone]
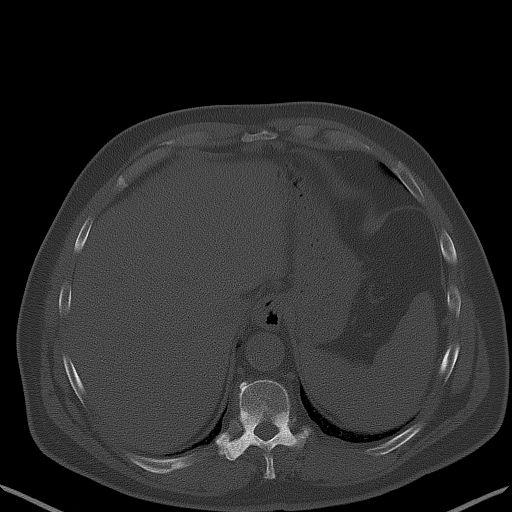

[Series 605: range-ct sk_thigh 5.0 hd_fov-tra-<alpha range> · 5 of 213 slices shown]
[im 1/213]
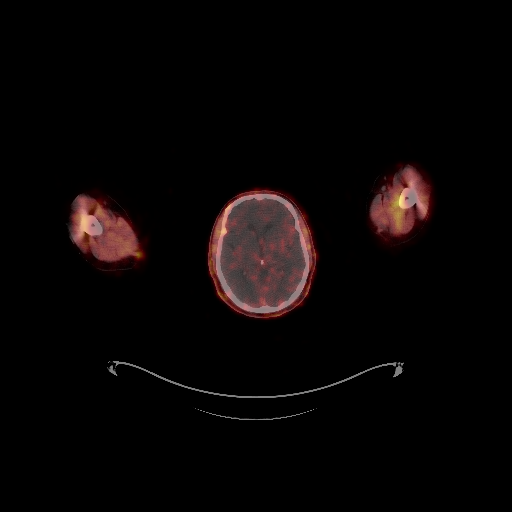
[im 54/213]
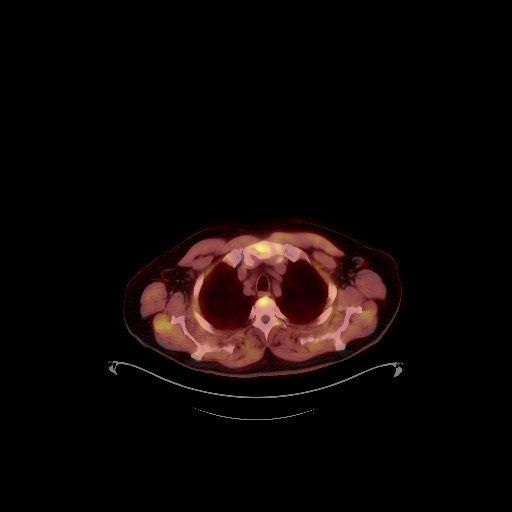
[im 107/213]
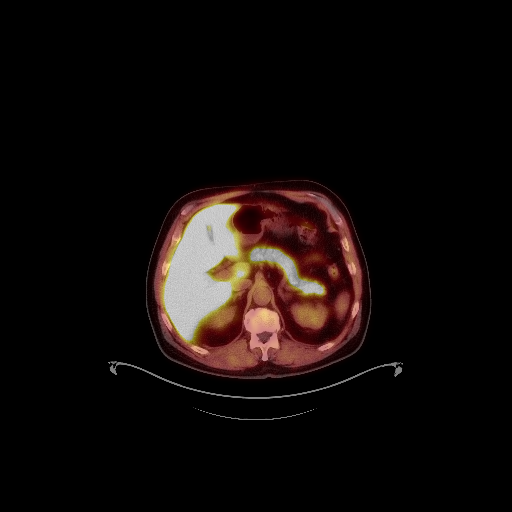
[im 160/213]
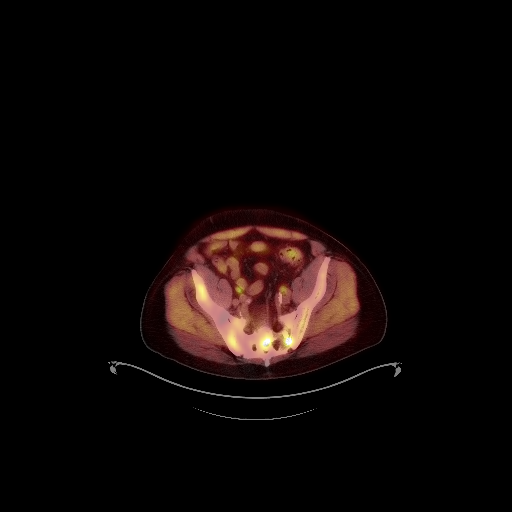
[im 213/213]
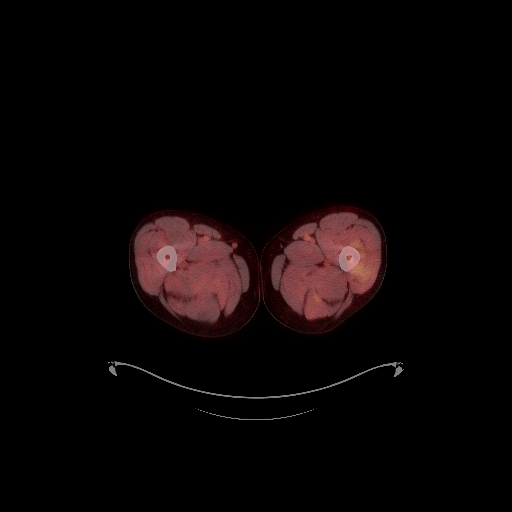

[Series 606: range-ct sk_thigh 5.0 hd_fov-cor-<alpha range(1)> · 1 of 56 slices shown]
[im 1/56]
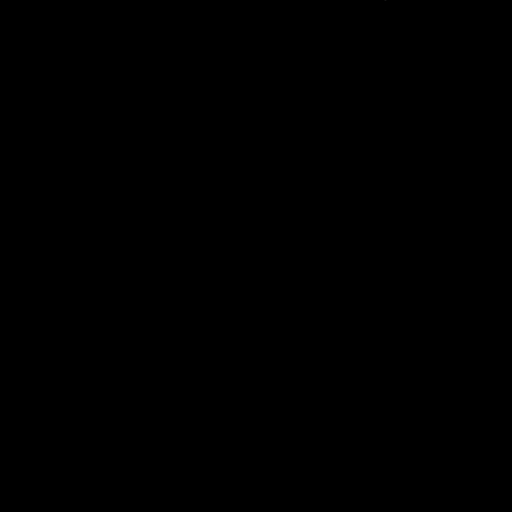

[Series 1116: results mm oncology reading · 4.0mm · 1.22mm/px · 1 of 6 slices shown]
[im 1/6]
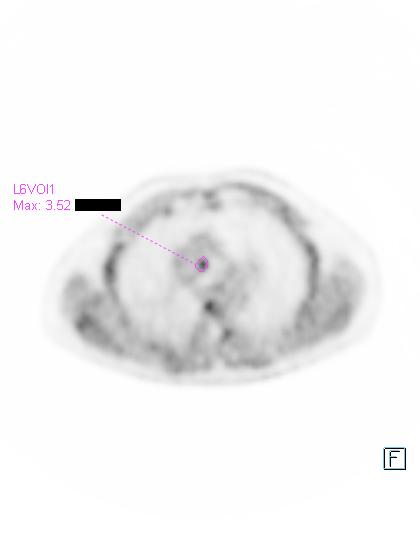

[25 of 25 positions shown; findings below may reference images not displayed]

FINDINGS: NECK

No radiotracer activity in neck lymph nodes.

Incidental CT finding: None

CHEST

Decreased radiotracer accumulation within the perihilar and
mediastinal lymph nodes. Only 1 lymph node remains with active
radiotracer accumulation in a RIGHT lower paratracheal lymph node
with SUV max equal 3.5 compared to SUV max equal 3.7. Previously
there were approximately 8 mediastinal hilar lymph nodes which
accumulate radiotracer.

Incidental CT finding: Multiple small pulmonary nodules have
decreased in size and for the most part are no longer present.

ABDOMEN/PELVIS

Prostate: No focal activity in the prostate bed.

Lymph nodes: No abnormal radiotracer accumulation within pelvic or
abdominal nodes.

Liver: No evidence of liver metastasis

Incidental CT finding: None

SKELETON

There are new sites of radiotracer accumulation within the skeleton.
There multiple sites of new sclerosis within the bone. The sites of
new radiotracer accumulation do not correlate with the new sclerotic
lesions

For example lesion in the central sacrum with SUV max equal
(image 69) is new from comparison exam. There is a densely sclerotic
lesion in the RIGHT sacral ala adjacent to the new activity.

There multiple new lesions within the thoracic spine. There are new
sclerotic lesion on CT portion and new radiotracer accumulation on
the PET scan data. Again the new radiotracer accumulation [REDACTED]
with non sclerotic bones. For example lesion lesions at T8 and T10
with SUV max equal

There is a new lesion within the sternum with SUV max equal 7.2.
IMPRESSION: 1. Mixed response to therapy
2. Resolution of all but one metastatic lymph nodes within the
mediastinum and hilum and resolution of bilateral pulmonary nodules.
This suggest positive therapy response.
3. Progression of skeletal metastasis with multiple new foci of
intense radiotracer activity within the pelvis, spine and sternum.
These new skeletal metastasis are not associated sclerotic lesions.
4. Multiple new sclerotic skeletal metastasis which do not
accumulate radiotracer. Findings suggest progression of skeletal
metastasis with subsequent positive treatment response versus
exclusion of radiotracer in the densely sclerotic lesions.
